# Patient Record
Sex: Female | Born: 1950 | Hispanic: No | Marital: Married | Smoking: Never smoker
Health system: Southern US, Community
[De-identification: ages and names within clinical notes are randomized; demographics above are authoritative.]

## PROBLEM LIST (undated history)

## (undated) DIAGNOSIS — E7800 Pure hypercholesterolemia, unspecified: Secondary | ICD-10-CM

## (undated) DIAGNOSIS — E0852 Diabetes mellitus due to underlying condition with diabetic peripheral angiopathy with gangrene: Secondary | ICD-10-CM

## (undated) DIAGNOSIS — D225 Melanocytic nevi of trunk: Secondary | ICD-10-CM

## (undated) DIAGNOSIS — D329 Benign neoplasm of meninges, unspecified: Secondary | ICD-10-CM

## (undated) DIAGNOSIS — K219 Gastro-esophageal reflux disease without esophagitis: Secondary | ICD-10-CM

## (undated) DIAGNOSIS — I499 Cardiac arrhythmia, unspecified: Secondary | ICD-10-CM

## (undated) DIAGNOSIS — K760 Fatty (change of) liver, not elsewhere classified: Secondary | ICD-10-CM

## (undated) DIAGNOSIS — E119 Type 2 diabetes mellitus without complications: Secondary | ICD-10-CM

## (undated) DIAGNOSIS — R809 Proteinuria, unspecified: Secondary | ICD-10-CM

## (undated) DIAGNOSIS — E559 Vitamin D deficiency, unspecified: Secondary | ICD-10-CM

## (undated) DIAGNOSIS — R000 Tachycardia, unspecified: Secondary | ICD-10-CM

## (undated) DIAGNOSIS — I10 Essential (primary) hypertension: Secondary | ICD-10-CM

## (undated) DIAGNOSIS — R569 Unspecified convulsions: Secondary | ICD-10-CM

## (undated) HISTORY — DX: Melanocytic nevi of trunk: D22.5

## (undated) HISTORY — DX: Type 2 diabetes mellitus without complications: E11.9

## (undated) HISTORY — DX: Fatty (change of) liver, not elsewhere classified: K76.0

## (undated) HISTORY — DX: Essential (primary) hypertension: I10

## (undated) HISTORY — PX: DILATION AND CURETTAGE OF UTERUS: SHX78

## (undated) HISTORY — DX: Proteinuria, unspecified: R80.9

## (undated) HISTORY — PX: CHOLECYSTECTOMY: SHX55

## (undated) HISTORY — DX: Unspecified convulsions: R56.9

## (undated) HISTORY — DX: Tachycardia, unspecified: R00.0

## (undated) HISTORY — DX: Gastro-esophageal reflux disease without esophagitis: K21.9

## (undated) HISTORY — PX: UTERINE FIBROID SURGERY: SHX826

## (undated) HISTORY — DX: Diabetes mellitus due to underlying condition with diabetic peripheral angiopathy with gangrene: E08.52

## (undated) HISTORY — DX: Vitamin D deficiency, unspecified: E55.9

---

## 2009-07-09 ENCOUNTER — Ambulatory Visit: Payer: Self-pay | Admitting: Gynecology

## 2009-07-13 ENCOUNTER — Ambulatory Visit: Payer: Self-pay | Admitting: Gynecology

## 2009-08-06 ENCOUNTER — Ambulatory Visit (HOSPITAL_COMMUNITY): Admission: RE | Admit: 2009-08-06 | Discharge: 2009-08-06 | Payer: Self-pay | Admitting: Gynecology

## 2009-08-06 ENCOUNTER — Ambulatory Visit: Payer: Self-pay | Admitting: Gynecology

## 2009-08-20 ENCOUNTER — Ambulatory Visit: Payer: Self-pay | Admitting: Gynecology

## 2010-06-20 HISTORY — PX: BRAIN SURGERY: SHX531

## 2010-07-11 ENCOUNTER — Encounter: Payer: Self-pay | Admitting: Family Medicine

## 2010-09-09 LAB — BASIC METABOLIC PANEL
BUN: 16 mg/dL (ref 6–23)
CO2: 29 mEq/L (ref 19–32)
Calcium: 9.4 mg/dL (ref 8.4–10.5)
Chloride: 101 mEq/L (ref 96–112)
Creatinine, Ser: 0.69 mg/dL (ref 0.4–1.2)
GFR calc Af Amer: >60 mL/min (ref >60)
GFR calc non Af Amer: >60 mL/min (ref >60)
Glucose, Bld: 80 mg/dL (ref 70–99)
Potassium: 3.7 mEq/L (ref 3.5–5.1)
Sodium: 136 mEq/L (ref 135–145)

## 2010-09-09 LAB — CBC
HCT: 40.5 % (ref 36.0–46.0)
Hemoglobin: 13.3 g/dL (ref 12.0–15.0)
MCHC: 33 g/dL (ref 30.0–36.0)
MCV: 83.6 fL (ref 78.0–100.0)
Platelets: 363 10*3/uL (ref 150–400)
RBC: 4.84 MIL/uL (ref 3.87–5.11)
RDW: 14 % (ref 11.5–15.5)
WBC: 12.5 10*3/uL — ABNORMAL HIGH (ref 4.0–10.5)

## 2010-09-09 LAB — GLUCOSE, CAPILLARY: Glucose-Capillary: 119 mg/dL — ABNORMAL HIGH (ref 70–99)

## 2011-04-12 ENCOUNTER — Encounter: Payer: Self-pay | Admitting: Physician Assistant

## 2011-04-12 DIAGNOSIS — K760 Fatty (change of) liver, not elsewhere classified: Secondary | ICD-10-CM | POA: Insufficient documentation

## 2011-04-12 DIAGNOSIS — E559 Vitamin D deficiency, unspecified: Secondary | ICD-10-CM | POA: Insufficient documentation

## 2011-04-12 DIAGNOSIS — R809 Proteinuria, unspecified: Secondary | ICD-10-CM | POA: Insufficient documentation

## 2011-04-12 DIAGNOSIS — I10 Essential (primary) hypertension: Secondary | ICD-10-CM | POA: Insufficient documentation

## 2011-04-12 DIAGNOSIS — G43909 Migraine, unspecified, not intractable, without status migrainosus: Secondary | ICD-10-CM | POA: Insufficient documentation

## 2011-04-12 DIAGNOSIS — D225 Melanocytic nevi of trunk: Secondary | ICD-10-CM | POA: Insufficient documentation

## 2011-04-12 DIAGNOSIS — K219 Gastro-esophageal reflux disease without esophagitis: Secondary | ICD-10-CM | POA: Insufficient documentation

## 2011-04-12 DIAGNOSIS — E119 Type 2 diabetes mellitus without complications: Secondary | ICD-10-CM | POA: Insufficient documentation

## 2011-04-12 LAB — BASIC METABOLIC PANEL: Glucose: 138 mg/dL

## 2011-04-12 LAB — HEMOGLOBIN A1C: Hgb A1c MFr Bld: 5.9 % (ref 4.0–6.0)

## 2011-04-12 LAB — HM DIABETES FOOT EXAM: HM Diabetic Foot Exam: NORMAL

## 2011-05-25 ENCOUNTER — Ambulatory Visit: Payer: Self-pay | Admitting: Family Medicine

## 2011-05-27 ENCOUNTER — Encounter (INDEPENDENT_AMBULATORY_CARE_PROVIDER_SITE_OTHER): Payer: BC Managed Care – PPO | Admitting: Family Medicine

## 2011-05-27 DIAGNOSIS — R51 Headache: Secondary | ICD-10-CM

## 2011-05-27 DIAGNOSIS — R6889 Other general symptoms and signs: Secondary | ICD-10-CM

## 2011-05-27 DIAGNOSIS — I10 Essential (primary) hypertension: Secondary | ICD-10-CM

## 2011-05-31 ENCOUNTER — Other Ambulatory Visit: Payer: Self-pay | Admitting: Family Medicine

## 2011-05-31 DIAGNOSIS — G8929 Other chronic pain: Secondary | ICD-10-CM

## 2011-06-02 ENCOUNTER — Ambulatory Visit: Payer: Self-pay | Admitting: Family Medicine

## 2011-06-03 ENCOUNTER — Ambulatory Visit
Admission: RE | Admit: 2011-06-03 | Discharge: 2011-06-03 | Disposition: A | Discharge status: Home / Self Care | Payer: BC Managed Care – PPO | Source: Ambulatory Visit | Attending: Family Medicine | Admitting: Family Medicine

## 2011-06-03 ENCOUNTER — Inpatient Hospital Stay (HOSPITAL_COMMUNITY): Payer: BC Managed Care – PPO

## 2011-06-03 ENCOUNTER — Inpatient Hospital Stay (HOSPITAL_COMMUNITY)
Admission: AD | Admit: 2011-06-03 | Discharge: 2011-06-13 | DRG: 001 | Disposition: A | Discharge status: Home / Self Care | Payer: BC Managed Care – PPO | Source: Ambulatory Visit | Attending: Family Medicine | Admitting: Family Medicine

## 2011-06-03 DIAGNOSIS — Z794 Long term (current) use of insulin: Secondary | ICD-10-CM

## 2011-06-03 DIAGNOSIS — E1159 Type 2 diabetes mellitus with other circulatory complications: Secondary | ICD-10-CM | POA: Diagnosis present

## 2011-06-03 DIAGNOSIS — G43009 Migraine without aura, not intractable, without status migrainosus: Secondary | ICD-10-CM

## 2011-06-03 DIAGNOSIS — R413 Other amnesia: Secondary | ICD-10-CM | POA: Diagnosis present

## 2011-06-03 DIAGNOSIS — G8929 Other chronic pain: Secondary | ICD-10-CM

## 2011-06-03 DIAGNOSIS — K7689 Other specified diseases of liver: Secondary | ICD-10-CM | POA: Diagnosis present

## 2011-06-03 DIAGNOSIS — E559 Vitamin D deficiency, unspecified: Secondary | ICD-10-CM | POA: Diagnosis present

## 2011-06-03 DIAGNOSIS — E871 Hypo-osmolality and hyponatremia: Secondary | ICD-10-CM | POA: Diagnosis not present

## 2011-06-03 DIAGNOSIS — I798 Other disorders of arteries, arterioles and capillaries in diseases classified elsewhere: Secondary | ICD-10-CM | POA: Diagnosis present

## 2011-06-03 DIAGNOSIS — D329 Benign neoplasm of meninges, unspecified: Secondary | ICD-10-CM | POA: Diagnosis present

## 2011-06-03 DIAGNOSIS — I10 Essential (primary) hypertension: Secondary | ICD-10-CM

## 2011-06-03 DIAGNOSIS — D496 Neoplasm of unspecified behavior of brain: Secondary | ICD-10-CM

## 2011-06-03 DIAGNOSIS — E119 Type 2 diabetes mellitus without complications: Secondary | ICD-10-CM | POA: Insufficient documentation

## 2011-06-03 DIAGNOSIS — D320 Benign neoplasm of cerebral meninges: Principal | ICD-10-CM | POA: Diagnosis present

## 2011-06-03 DIAGNOSIS — J387 Other diseases of larynx: Secondary | ICD-10-CM | POA: Diagnosis present

## 2011-06-03 DIAGNOSIS — G936 Cerebral edema: Secondary | ICD-10-CM | POA: Diagnosis present

## 2011-06-03 DIAGNOSIS — G43909 Migraine, unspecified, not intractable, without status migrainosus: Secondary | ICD-10-CM | POA: Diagnosis present

## 2011-06-03 HISTORY — DX: Pure hypercholesterolemia, unspecified: E78.00

## 2011-06-03 HISTORY — DX: Benign neoplasm of meninges, unspecified: D32.9

## 2011-06-03 HISTORY — DX: Cardiac arrhythmia, unspecified: I49.9

## 2011-06-03 LAB — CBC
HCT: 39.3 % (ref 36.0–46.0)
Hemoglobin: 13 g/dL (ref 12.0–15.0)
MCH: 27.1 pg (ref 26.0–34.0)
MCHC: 33.1 g/dL (ref 30.0–36.0)
MCV: 82 fL (ref 78.0–100.0)
Platelets: 432 10*3/uL — ABNORMAL HIGH (ref 150–400)
RBC: 4.79 MIL/uL (ref 3.87–5.11)
RDW: 13.8 % (ref 11.5–15.5)
WBC: 15.2 10*3/uL — ABNORMAL HIGH (ref 4.0–10.5)

## 2011-06-03 LAB — COMPREHENSIVE METABOLIC PANEL
ALT: 19 U/L (ref 0–35)
AST: 18 U/L (ref 0–37)
Albumin: 3.6 g/dL (ref 3.5–5.2)
Alkaline Phosphatase: 64 U/L (ref 39–117)
BUN: 17 mg/dL (ref 6–23)
CO2: 22 mEq/L (ref 19–32)
Calcium: 9.1 mg/dL (ref 8.4–10.5)
Chloride: 101 mEq/L (ref 96–112)
Creatinine, Ser: 0.73 mg/dL (ref 0.50–1.10)
GFR calc Af Amer: >90 mL/min (ref >90)
GFR calc non Af Amer: >90 mL/min (ref >90)
Glucose, Bld: 111 mg/dL — ABNORMAL HIGH (ref 70–99)
Potassium: 3.7 mEq/L (ref 3.5–5.1)
Sodium: 135 mEq/L (ref 135–145)
Total Bilirubin: 0.1 mg/dL — ABNORMAL LOW (ref 0.3–1.2)
Total Protein: 7.1 g/dL (ref 6.0–8.3)

## 2011-06-03 LAB — TYPE AND SCREEN
ABO/RH(D): O NEG
Antibody Screen: NEGATIVE

## 2011-06-03 LAB — PROTIME-INR
INR: 0.94 (ref 0.00–1.49)
Prothrombin Time: 12.8 seconds (ref 11.6–15.2)

## 2011-06-03 LAB — GLUCOSE, CAPILLARY: Glucose-Capillary: 102 mg/dL — ABNORMAL HIGH (ref 70–99)

## 2011-06-03 LAB — APTT: aPTT: 31 seconds (ref 24–37)

## 2011-06-03 LAB — ABO/RH: ABO/RH(D): O NEG

## 2011-06-03 MED ORDER — SODIUM CHLORIDE 0.9 % IV SOLN: 250.0000 mL | INTRAVENOUS | Status: DC | PRN

## 2011-06-03 MED ORDER — INSULIN ASPART 100 UNIT/ML ~~LOC~~ SOLN
0.0000 [IU] | Freq: Three times a day (TID) | SUBCUTANEOUS | Status: DC
  Administered 2011-06-04: 7 [IU] via SUBCUTANEOUS
  Administered 2011-06-04: 2 [IU] via SUBCUTANEOUS
  Administered 2011-06-04: 7 [IU] via SUBCUTANEOUS
  Administered 2011-06-05 (×2): 3 [IU] via SUBCUTANEOUS
  Administered 2011-06-05 – 2011-06-06 (×4): 5 [IU] via SUBCUTANEOUS
  Administered 2011-06-07: 11 [IU] via SUBCUTANEOUS
  Administered 2011-06-07: 5 [IU] via SUBCUTANEOUS
  Administered 2011-06-07: 8 [IU] via SUBCUTANEOUS
  Administered 2011-06-08: 3 [IU] via SUBCUTANEOUS
  Filled 2011-06-03: qty 3

## 2011-06-03 MED ORDER — GADOBENATE DIMEGLUMINE 529 MG/ML IV SOLN
20.0000 mL | Freq: Once | INTRAVENOUS | Status: AC | PRN
  Administered 2011-06-03: 20 mL via INTRAVENOUS

## 2011-06-03 MED ORDER — DEXAMETHASONE 4 MG PO TABS
4.0000 mg | ORAL_TABLET | Freq: Four times a day (QID) | ORAL | Status: DC
  Administered 2011-06-03 – 2011-06-09 (×22): 4 mg via ORAL
  Filled 2011-06-03 (×27): qty 1

## 2011-06-03 MED ORDER — SODIUM CHLORIDE 0.9 % IJ SOLN: 3.0000 mL | INTRAMUSCULAR | Status: DC | PRN

## 2011-06-03 MED ORDER — SODIUM CHLORIDE 0.9 % IJ SOLN
3.0000 mL | Freq: Two times a day (BID) | INTRAMUSCULAR | Status: DC
  Administered 2011-06-03 – 2011-06-08 (×9): 3 mL via INTRAVENOUS

## 2011-06-03 NOTE — Progress Notes (Signed)
MD on call paged and notified about the new admit.

## 2011-06-03 NOTE — H&P (Signed)
Family Medicine Teaching [facility] Admission History and Physical  Patient name: Victoria Mullen Medical record number: [id number] Date of birth: Feb 19, 1951 Age: 60 y.o. Gender: female  Primary Care Provider: Dow Adolph, MD,   Chief Complaint: Headache History of Present Illness: Victoria Mullen is a 60 y.o. year old female presenting with intense headache for 1 month. She has hx of migraine for years that has been increasing in intensity and frequency for the past month. They happen almost every day and last for hours. They alleviate with ibuprofen 800 mg or by resting, but there is a basal level of discomfort even when the acute pain is gone. They are preceded by visual aura of "neon colored floaters" and are associated with nausea and vomiting. The pain  localization is generalized and there is no associated symptoms of decreased of sensation, weakness, numbness, tingling. The only positive finding is that she does not noticed it, but her husband mentions she has problems with short term memory lately and becoming repetitive. Pt was a direct admit from [facility] urgent care.  Past Medical History: Diagnosis  . Migraine  . Essential hypertension, benign  . Fatty liver disease, nonalcoholic  . 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 Melanocytic nevi of trunk  . Tachycardia, unspecified  . Vitamin D insufficiency  . Microalbuminuria  . Laryngopharyngeal reflux (LPR)    Past Surgical History: Gall bladder removed years ago.  Social History: Lives with husband. No children. Non smoker, no drugs or alcohol.   Family History: Mother died of squamous cell carcinoma of Larynx(smoker). Rest unremarkable.   Allergies: . Niaspan (Niacin (Antihyperlipidemic)) Other (See Comments)    Flushing-sleep disruption    Review Of Systems: Per HPI  Otherwise 12 point review of systems was performed and was  unremarkable.  Physical Exam: Filed Vitals:   06/03/11 1700  BP: 108/65  Pulse: 99  Temp: 98.2 F (36.8 C)  Resp: 20   General: alert, cooperative, appears stated age and no distress HEENT: extra ocular movement intact, sclera clear, anicteric and oropharynx clear, no lesions Heart: RRR, no murmurs, gallops or rubs. Lungs: Normal work of breathing. Clear breath sound bilaterally, no rales or wheezes. Abdomen: Soft, no tender to palpation. Normal bowel sounds. Extremities: extremities normal, atraumatic, no cyanosis or edema Neurology: normal without focal findings, mental status, speech normal, alert and oriented x3, PERLA, cranial nerves 2-12 intact, reflexes normal and symmetric, sensation grossly normal and finger to nose and cerebellar exam normal  Labs and Imaging: Component Value   NA 136   K 3.7   CL 101   CO2 29   BUN 16   CREATININE 0.69   GLUCOSE 80   CT head without contrast 06/03/11 IMPRESSION:  1. Large anterior cranial fossa tumor resulting in left greater than right frontal lobe edema and mass effect, including anterior rightward midline shift of up to 11 mm.  Top differential considerations are large meningioma and high-grade primary brain tumor. Follow-up brain MRI without and with contrast recommended to further characterize preoperatively.  Critical Value/emergent results were called by telephone at the time of interpretation on 06/03/2011 at 1650 hours to PA Kennedy Bucker, who verbally acknowledged these results. Ct cervical spine 06/03/11 IMPRESSION:  No acute fracture or listhesis identified in the cervical spine. Ligamentous injury is not excluded.   Assessment and Plan: Victoria Mullen is a 60 y.o. year old female presenting with headache and a CT positive for anterior cranial  fossa tumor with edema and mass effect. 1.  Neuro: No neurologic focalization on physical exam. Pt hemodynamically stable. No headache now.  Dr. Venetia Maxon of Neurosurgery has been  consulted.The plan is inpatient surgery.  - start Decadron to help with cerebral edema. Neurological monitoring. Preop tests. We will f/u Neurosurgery recommendations. 2.  HTN: Controlled with home meds (valsartan/HCTZ).  - will continue home meds starting tomorrow and monitor. 3.  DM: A1C 6.3 Normal glucose today.  -SSI and CHO diet. 4.FEN/GI: CHO diet . SL IV 5. Prophylaxis: SCD's, PPI 6. Disposition: Pending improvement and surgery.

## 2011-06-04 LAB — CBC
HCT: 38.6 % (ref 36.0–46.0)
Hemoglobin: 12.6 g/dL (ref 12.0–15.0)
MCH: 27.1 pg (ref 26.0–34.0)
MCHC: 32.6 g/dL (ref 30.0–36.0)
MCV: 83 fL (ref 78.0–100.0)
Platelets: 412 10*3/uL — ABNORMAL HIGH (ref 150–400)
RBC: 4.65 MIL/uL (ref 3.87–5.11)
RDW: 13.8 % (ref 11.5–15.5)
WBC: 12.5 10*3/uL — ABNORMAL HIGH (ref 4.0–10.5)

## 2011-06-04 LAB — HEMOGLOBIN A1C
Hgb A1c MFr Bld: 6.2 % — ABNORMAL HIGH (ref <5.7)
Mean Plasma Glucose: 131 mg/dL — ABNORMAL HIGH (ref <117)

## 2011-06-04 LAB — GLUCOSE, CAPILLARY
Glucose-Capillary: 135 mg/dL — ABNORMAL HIGH (ref 70–99)
Glucose-Capillary: 190 mg/dL — ABNORMAL HIGH (ref 70–99)
Glucose-Capillary: 266 mg/dL — ABNORMAL HIGH (ref 70–99)
Glucose-Capillary: 287 mg/dL — ABNORMAL HIGH (ref 70–99)

## 2011-06-04 NOTE — Consult Note (Signed)
Reason for Consult:brain tumor Referring Physician: [name] is an 60 y.o. female.  Victoria Mullen is a 60 y.o. year old female presenting with intense headache for 1 month. She has hx of migraine for years that has been increasing in intensity and frequency for the past month. They happen almost every day and last for hours. They alleviate with ibuprofen 800 mg or by resting, but there is a basal level of discomfort even when the acute pain is gone. They are preceded by visual aura of "neon colored floaters" and are associated with nausea and vomiting. The pain localization is generalized and there is no associated symptoms of decreased of sensation, weakness, numbness, tingling. The only positive finding is that she does not noticed it, but her husband mentions she has problems with short term memory lately and becoming repetitive.  Pt was a direct admit from [facility] urgent care. Patient notes both progressive worsening of headaches over the last few months but also significant problems with short-term memory. She has become quite forgetful and no longer has any energy or interest in the things that she usually has enjoyed. 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  Past Medical History:  Diagnosis   .  Migraine   .  Essential hypertension, benign   .  Fatty liver disease, nonalcoholic   .  Diabetes mellitus due to underlying condition with diabetic peripheral angiopathy with gangrene   .  Melanocytic nevi of trunk   .  Tachycardia, unspecified   .  Vitamin D insufficiency   .  Microalbuminuria   .  Laryngopharyngeal reflux (LPR)    Past Surgical History:  Gall bladder removed years ago.   Social History:  Lives with husband. Husband works as Radiographer, therapeutic. No children. Non smoker, no drugs or alcohol.   Family  History:  Mother died of squamous cell carcinoma of Larynx(smoker). Rest unremarkable.   Allergies:  .  Niaspan (Niacin (Antihyperlipidemic))  Other (See Comments)     Flushing-sleep disruption    Review Of Systems: Per HPI  Otherwise 12 point review of systems was performed and was unremarkable.   Medications: I have reviewed the patient's current medications.  Results for orders placed during the hospital encounter of 06/03/11 (from the past 48 hour(s))  TYPE AND SCREEN     Status: Normal   Collection Time   06/03/11  7:47 PM      Component Value Range Comment   ABO/RH(D) O NEG      Antibody Screen NEG      Sample Expiration 06/06/2011     ABO/RH     Status: Normal   Collection Time   06/03/11  7:47 PM      Component Value Range Comment   ABO/RH(D) O NEG     COMPREHENSIVE METABOLIC PANEL     Status: Abnormal   Collection Time   06/03/11  7:50 PM      Component Value Range Comment   Sodium 135  135 - 145 (mEq/L)    Potassium 3.7  3.5 - 5.1 (mEq/L)    Chloride 101  96 - 112 (mEq/L)    CO2 22  19 - 32 (mEq/L)    Glucose, Bld 111 (*) 70 - 99 (mg/dL)    BUN 17  6 - 23 (mg/dL)    Creatinine, Ser  0.73  0.50 - 1.10 (mg/dL)    Calcium 9.1  8.4 - 10.5 (mg/dL)    Total Protein 7.1  6.0 - 8.3 (g/dL)    Albumin 3.6  3.5 - 5.2 (g/dL)    AST 18  0 - 37 (U/L) HEMOLYSIS AT THIS LEVEL MAY AFFECT RESULT   ALT 19  0 - 35 (U/L)    Alkaline Phosphatase 64  39 - 117 (U/L)    Total Bilirubin 0.1 (*) 0.3 - 1.2 (mg/dL)    GFR calc non Af Amer >90  >90 (mL/min)    GFR calc Af Amer >90  >90 (mL/min)   CBC     Status: Abnormal   Collection Time   06/03/11  7:50 PM      Component Value Range Comment   WBC 15.2 (*) 4.0 - 10.5 (K/uL)    RBC 4.79  3.87 - 5.11 (MIL/uL)    Hemoglobin 13.0  12.0 - 15.0 (g/dL)    HCT 45.4  09.8 - 11.9 (%)    MCV 82.0  78.0 - 100.0 (fL)    MCH 27.1  26.0 - 34.0 (pg)    MCHC 33.1  30.0 - 36.0 (g/dL)    RDW 14.7  82.9 - 56.2 (%)    Platelets 432 (*) 150 - 400  (K/uL)   PROTIME-INR     Status: Normal   Collection Time   06/03/11  7:50 PM      Component Value Range Comment   Prothrombin Time 12.8  11.6 - 15.2 (seconds)    INR 0.94  0.00 - 1.49    APTT     Status: Normal   Collection Time   06/03/11  7:50 PM      Component Value Range Comment   aPTT 31  24 - 37 (seconds)   HEMOGLOBIN A1C     Status: Abnormal   Collection Time   06/03/11  7:50 PM      Component Value Range Comment   Hemoglobin A1C 6.2 (*) <5.7 (%)    Mean Plasma Glucose 131 (*) <117 (mg/dL)   GLUCOSE, CAPILLARY     Status: Abnormal   Collection Time   06/03/11 10:01 PM      Component Value Range Comment   Glucose-Capillary 102 (*) 70 - 99 (mg/dL)    Comment 1 Documented in Chart      Comment 2 Notify RN     CBC     Status: Abnormal   Collection Time   06/04/11  7:40 AM      Component Value Range Comment   WBC 12.5 (*) 4.0 - 10.5 (K/uL)    RBC 4.65  3.87 - 5.11 (MIL/uL)    Hemoglobin 12.6  12.0 - 15.0 (g/dL)    HCT 13.0  86.5 - 78.4 (%)    MCV 83.0  78.0 - 100.0 (fL)    MCH 27.1  26.0 - 34.0 (pg)    MCHC 32.6  30.0 - 36.0 (g/dL)    RDW 69.6  29.5 - 28.4 (%)    Platelets 412 (*) 150 - 400 (K/uL)   GLUCOSE, CAPILLARY     Status: Abnormal   Collection Time   06/04/11  7:59 AM      Component Value Range Comment   Glucose-Capillary 135 (*) 70 - 99 (mg/dL)     Dg Chest 2 View  13/24/4010  *RADIOLOGY REPORT*  Clinical Data: Pre op for surg  CHEST - 2 VIEW  Comparison: None.  Findings: The  heart size and mediastinal contours are within normal limits.  Both lungs are clear.  The visualized skeletal structures are unremarkable.  IMPRESSION: Negative exam.  Original Report Authenticated By: Rosealee Albee, M.D.   Ct Head Wo Contrast  06/03/2011  *RADIOLOGY REPORT*  Clinical Data:  60 year old female with chronic headaches and memory loss.  CT HEAD WITHOUT CONTRAST CT CERVICAL SPINE WITHOUT CONTRAST  Technique:  Multidetector CT imaging of the head and cervical spine  was performed following the standard protocol without intravenous contrast.  Multiplanar CT image reconstructions of the cervical spine were also generated.  Comparison:  None.  CT HEAD  Findings: Large anterior heterogeneous intracranial mass lesion affects both inferior frontal gyri.  There are both hyperdense, partially calcified components, and low density cystic appearing components.  Cerebral edema tracks from the anterior frontal lobes posteriorly, and on the left extends through the external capsule. There is rightward midline shift of 11 mm.  No ventriculomegaly. Edema is also noted in the left anterior temporal tip.  No acute intracranial hemorrhage identified.  No second intracranial mass is identified.  Visualized paranasal sinuses and mastoids are clear.  Visualized orbit soft tissues are within normal limits.  No bony changes in the anterior cranial fossa are identified.  IMPRESSION: 1.  Large anterior cranial fossa tumor resulting in left greater than right frontal lobe edema and mass effect, including anterior rightward midline shift of up to 11 mm. Top differential considerations are large meningioma and high-grade primary brain tumor.  Follow-up brain MRI without and with contrast recommended to further characterize preoperatively.  Critical Value/emergent results were called by telephone at the time of interpretation on 06/03/2011  at 1650 hours   to  PA Kennedy Bucker, who verbally acknowledged these results.  2.  Cervical findings are below.  CT CERVICAL SPINE  Findings: Visualized skull base is intact.  No atlanto-occipital dissociation.  Mild straightening of cervical lordosis.  Disc space narrowing with endplate spurring at C5-C6 and C6-C7.  Trace anterolisthesis of C7 on T1 is associated with the facet hypertrophy. Bilateral posterior element alignment is within normal limits.  No acute cervical fracture identified.  Negative visualized lung apices.  Enlarged right thyroid lobe has  homogeneous CT density.  Other visualized paraspinal soft tissues are within normal limits.  IMPRESSION: No acute fracture or listhesis identified in the cervical spine. Ligamentous injury is not excluded.  Original Report Authenticated By: Harley Hallmark, M.D.   Ct Cervical Spine Wo Contrast  06/03/2011  *RADIOLOGY REPORT*  Clinical Data:  60 year old female with chronic headaches and memory loss.  CT HEAD WITHOUT CONTRAST CT CERVICAL SPINE WITHOUT CONTRAST  Technique:  Multidetector CT imaging of the head and cervical spine was performed following the standard protocol without intravenous contrast.  Multiplanar CT image reconstructions of the cervical spine were also generated.  Comparison:  None.  CT HEAD  Findings: Large anterior heterogeneous intracranial mass lesion affects both inferior frontal gyri.  There are both hyperdense, partially calcified components, and low density cystic appearing components.  Cerebral edema tracks from the anterior frontal lobes posteriorly, and on the left extends through the external capsule. There is rightward midline shift of 11 mm.  No ventriculomegaly. Edema is also noted in the left anterior temporal tip.  No acute intracranial hemorrhage identified.  No second intracranial mass is identified.  Visualized paranasal sinuses and mastoids are clear.  Visualized orbit soft tissues are within normal limits.  No bony changes in the anterior cranial fossa are  identified.  IMPRESSION: 1.  Large anterior cranial fossa tumor resulting in left greater than right frontal lobe edema and mass effect, including anterior rightward midline shift of up to 11 mm. Top differential considerations are large meningioma and high-grade primary brain tumor.  Follow-up brain MRI without and with contrast recommended to further characterize preoperatively.  Critical Value/emergent results were called by telephone at the time of interpretation on 06/03/2011  at 1650 hours   to  PA Kennedy Bucker,  who verbally acknowledged these results.  2.  Cervical findings are below.  CT CERVICAL SPINE  Findings: Visualized skull base is intact.  No atlanto-occipital dissociation.  Mild straightening of cervical lordosis.  Disc space narrowing with endplate spurring at C5-C6 and C6-C7.  Trace anterolisthesis of C7 on T1 is associated with the facet hypertrophy. Bilateral posterior element alignment is within normal limits.  No acute cervical fracture identified.  Negative visualized lung apices.  Enlarged right thyroid lobe has homogeneous CT density.  Other visualized paraspinal soft tissues are within normal limits.  IMPRESSION: No acute fracture or listhesis identified in the cervical spine. Ligamentous injury is not excluded.  Original Report Authenticated By: Harley Hallmark, M.D.   Mr Laqueta Jean ZO Contrast  06/03/2011  *RADIOLOGY REPORT*  Clinical Data: Headache.  Abnormal CT.  MRI HEAD WITHOUT AND WITH CONTRAST  Technique:  Multiplanar, multiecho pulse sequences of the brain and surrounding structures were obtained according to standard protocol without and with intravenous contrast  Contrast: 20mL MULTIHANCE GADOBENATE DIMEGLUMINE 529 MG/ML IV SOLN  Comparison: 06/03/2011 CT.  No comparison MR.  Findings: Mass based at the planum sphenoidale (and distorting the planum sphenoidale /sphenoid bone) has a solid enhancing component which is an extra-axial measuring 3.6 x 3.9 x 2.2 cm elevating the anterior cerebral arteries having an appearance most consistent with a planum sphenoidale meningioma.  This appears complex as there is an  irregular enhancement prominent / cystic component extending into the left frontal region causing marked distortion of the left frontal lobe and left lateral ventricle with posterior displacement of frontal horn of the left lateral ventricle and midline shift to the right by 10.7 mm.  This aspect of the mass has maximal dimension of 4.9 x 3.2 x 3.2 cm.  My overall suspicion is that this  represents a meningioma with an associated cystic components/arachnoid cyst with marked surrounding vasogenic edema extending throughout the frontal lobes bilaterally greater on the left with edema extending to the involve left basal ganglia, left sub insular region and periopercular region.  Other types of malignancy not entirely excluded although felt to be secondary considerations.  Early trapping of the right lateral ventricle may be present.  Lower aspect of the anterior falx just above the anterior body of the corpus callosum there is a 7.7 x 6.9 x 6.3 mm nodular enhancing structure which may represent a second meningioma.  Metastatic disease as cause of both of these findings felt to be less likely consideration.  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  No acute thrombotic infarct.  No intracranial hemorrhage.  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  Fusiform dilation of the left middle cerebral artery at the level of the bifurcation.  Original Report Authenticated By: Fuller Canada, M.D.    Review of Systems - Negative  except as above   Blood pressure 114/67, pulse 88, temperature 96.8 F (36 C), temperature source Oral, resp. rate 20, height 5' 3.6" (1.615 m), weight 73.8 kg (162 lb 11.2 oz), SpO2 97.00%. Patient is alert oriented and cooperative. She speaks with clear and fluent speech. She is somewhat forgetful and does not recall her age.  Pupils are equal round and reactive to light. Extraocular movements are full face is symmetric without numbness and with good motor function. Tongue protrudes in the midline. She denies loss of sense of smell.  Motor strength is full in all motor groups bilaterally symmetric without evidence of pronator drift. She  denies any numbness in her upper or lower extremities.  Reflexes are symmetric in the upper and lower extremities. Great toe is upgoing on the right and downgoing on the left.  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 I met with the patient and her husband and spent one hour in discussion with them both and have recommended that she undergo bicoronal craniotomy with resection of this large tumor. I also recommended that she continue on Decadron for at least several days before proceeding with surgery to control her bifrontal brain edema. We discussed risks and benefits of surgery as well as the expected course and potential surgical and perioperative complications. They understand these risks but also recognized that without surgery she will likely worsen and could eventually die from the significant brain edema. She wishes to proceed. I have spoken with my colleague, Dr. Coletta Memos about her problem and need for surgery and he will take over and perform her surgery.  Dorian Heckle, MD 06/04/2011, 11:29 AM

## 2011-06-04 NOTE — H&P (Signed)
[facility] Attending Admission Note: Kaloni Bisaillon MD [phone] pager office [phone] I  have seen and examined this patient, reviewed their chart. I have discussed this patient with the resident. I agree with the resident's findings, assessment and care plan. 

## 2011-06-04 NOTE — Progress Notes (Signed)
Subjective: No o/n events. Still c/o headache No neurological changes on 3 exams overnight. No emesis/nausea. No syncope.   Objective: Vital signs in last 24 hours: Temp:  [96.8 F (36 C)-98.2 F (36.8 C)] 96.8 F (36 C) (12/15 0540) Pulse Rate:  [82-99] 88  (12/15 0540) Resp:  [20] 20  (12/15 0540) BP: (108-116)/(65-71) 114/67 mmHg (12/15 0540) SpO2:  [95 %-97 %] 97 % (12/15 0540) Weight:  [162 lb 11.2 oz (73.8 kg)] 162 lb 11.2 oz (73.8 kg) (12/14 1700) Weight change:    Lungs:  Normal respiratory effort, chest expands symmetrically. Lungs are clear to auscultation, no crackles or wheezes. Heart - Regular rate and rhythm.  No murmurs, gallops or rubs.    Neurologic exam : Cn 2-7 intact Strength equal & normal in upper & lower extremities Able to walk on heels and toes.   Balance normal  Romberg normal, finger to nose   Intake/Output from previous day: 12/14 0701 - 12/15 0700 In: 350 [P.O.:350] Out: 1 [Urine:1] Intake/Output this shift: Total I/O In: 240 [P.O.:240] Out: -    Lab Results:  [facility] 06/04/11 0740 06/03/11 1950  WBC 12.5* 15.2*  HGB 12.6 13.0  HCT 38.6 39.3  PLT 412* 432*   BMET  Basename 06/03/11 1950  NA 135  K 3.7  CL 101  CO2 22  GLUCOSE 111*  BUN 17  CREATININE 0.73  CALCIUM 9.1    Studies/Results: MRI brain w/wo: IMPRESSION: 06/03/11 Findings suspicious for a complex planum sphenoidale meningioma  with marked local mass effect as described above with possible  second small meningioma along the inferior aspect of the falx.  Fusiform dilation of the left middle cerebral artery at the level  of the bifurcation.   Medications: I have reviewed the patient's current medications.  Assessment/Plan: 1. Brain Tumor -likely meningioma - Dr. Venetia Maxon with neurosurgery on board - preop labs done - decadron 4mg  q 6 hours prn per Neurosurgery - neurologically unchanged -continue neurochecks - possible OR tomorrow pending Dr. Venetia Maxon  consultation  2. DM Lab Results  Component Value Date   HGBA1C 6.2* 06/03/2011  continue with SSI   3. FENGI protonix scd's  4. Dispo: Pending neurosurgical planning  Macaulay Reicher MD 10:14 AM 06/04/2011    LOS: 1 day   Juanda Luba 06/04/2011, 10:10 AM

## 2011-06-05 LAB — GLUCOSE, CAPILLARY
Glucose-Capillary: 200 mg/dL — ABNORMAL HIGH (ref 70–99)
Glucose-Capillary: 236 mg/dL — ABNORMAL HIGH (ref 70–99)
Glucose-Capillary: 171 mg/dL — ABNORMAL HIGH (ref 70–99)
Glucose-Capillary: 278 mg/dL — ABNORMAL HIGH (ref 70–99)

## 2011-06-05 NOTE — Progress Notes (Signed)
FMTS Attending Daily Note: Denny Levy MD [phone] pager office [phone] I  have seen and examined this patient, reviewed their chart. I have discussed this patient with the resident. I agree with the resident's findings, assessment and care plan. Has not had headache since the decadron was started.[facility] plans surgery early this week.

## 2011-06-05 NOTE — Progress Notes (Signed)
Subjective: No o/n events. No neurological changes. No emesis/nausea. No syncope.   Objective: Vital signs in last 24 hours: Temp:  [96.6 F (35.9 C)-97.4 F (36.3 C)] 96.6 F (35.9 C) (12/16 0626) Pulse Rate:  [74-95] 74  (12/16 0626) Resp:  [18-20] 20  (12/16 0626) BP: (116-121)/(71-80) 119/80 mmHg (12/16 0626) SpO2:  [95 %-96 %] 95 % (12/16 0626) Weight change:    Lungs:  Normal respiratory effort, chest expands symmetrically. Lungs are clear to auscultation, no crackles or wheezes. Heart - Regular rate and rhythm.  No murmurs, gallops or rubs.    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  Fusiform dilation of the left middle cerebral artery at the level  of the bifurcation.   Medications: I have reviewed the patient's current medications.  Assessment/Plan: 1. Brain Tumor -likely meningioma - Dr. Venetia Maxon with neurosurgery on board - preop labs done - decadron 4mg  q 6 hours prn per Neurosurgery - neurologically unchanged -continue neurochecks - Continue decadrom- possible surgery in upcoming week- date/time of operation not yet known.  Will follow neurosurgery consult note.   2. DM Lab Results  Component Value Date   HGBA1C 6.2*  06/03/2011  continue with SSI, BG elevated into 200's now with decadron.  Will monitor closely.  Can consider adding lantus for BG tomorrow if CBG's remain elevated for 24 + hours.  Elevations most likely going to occur until pt finished with decadron doses.   3. FENGI protonix scd's  4. Dispo: Pending neurosurgical planning  Holmes Hays MD 6:58 AM 06/05/2011    LOS: 2 days   Porter Nakama 06/05/2011, 6:58 AM

## 2011-06-05 NOTE — Progress Notes (Signed)
Subjective: No headache today, does feel like she has more energy.  "Swimmy headed"  with standing.  Objective: Vital signs in last 24 hours: Temp:  [96.6 F (35.9 C)-97.4 F (36.3 C)] 96.6 F (35.9 C) (12/16 0626) Pulse Rate:  [74-95] 74  (12/16 0626) Resp:  [18-20] 20  (12/16 0626) BP: (116-121)/(71-80) 119/80 mmHg (12/16 0626) SpO2:  [95 %-96 %] 95 % (12/16 0626) Weight change:     Intake/Output from previous day: 12/15 0701 - 12/16 0700 In: 690 [P.O.:690] Out: 1 [Urine:1] Last cbgs: CBG (last 3)   Basename 06/04/11 2130 06/04/11 1713 06/04/11 1155  GLUCAP 287* 266* 190*     Physical Exam Brighter and more rapid responses to questions.  No drift.  Lab Results:  [facility] 06/04/11 0740 06/03/11 1950  WBC 12.5* 15.2*  HGB 12.6 13.0  HCT 38.6 39.3  PLT 412* 432*   BMET  Basename 06/03/11 1950  NA 135  K 3.7  CL 101  CO2 22  GLUCOSE 111*  BUN 17  CREATININE 0.73  CALCIUM 9.1    Studies/Results: Dg Chest 2 View  06/03/2011  *RADIOLOGY REPORT*  Clinical Data: Pre op for surg  CHEST - 2 VIEW  Comparison: None.  Findings: The heart size and mediastinal contours are within normal limits.  Both lungs are clear.  The visualized skeletal structures are unremarkable.  IMPRESSION: Negative exam.  Original Report Authenticated By: Rosealee Albee, M.D.   Ct Head Wo Contrast  06/03/2011  *RADIOLOGY REPORT*  Clinical Data:  60 year old female with chronic headaches and memory loss.  CT HEAD WITHOUT CONTRAST CT CERVICAL SPINE WITHOUT CONTRAST  Technique:  Multidetector CT imaging of the head and cervical spine was performed following the standard protocol without intravenous contrast.  Multiplanar CT image reconstructions of the cervical spine were also generated.  Comparison:  None.  CT HEAD  Findings: Large anterior heterogeneous intracranial mass lesion affects both inferior frontal gyri.  There are both hyperdense, partially calcified components, and low density cystic  appearing components.  Cerebral edema tracks from the anterior frontal lobes posteriorly, and on the left extends through the external capsule. There is rightward midline shift of 11 mm.  No ventriculomegaly. Edema is also noted in the left anterior temporal tip.  No acute intracranial hemorrhage identified.  No second intracranial mass is identified.  Visualized paranasal sinuses and mastoids are clear.  Visualized orbit soft tissues are within normal limits.  No bony changes in the anterior cranial fossa are identified.  IMPRESSION: 1.  Large anterior cranial fossa tumor resulting in left greater than right frontal lobe edema and mass effect, including anterior rightward midline shift of up to 11 mm. Top differential considerations are large meningioma and high-grade primary brain tumor.  Follow-up brain MRI without and with contrast recommended to further characterize preoperatively.  Critical Value/emergent results were called by telephone at the time of interpretation on 06/03/2011  at 1650 hours   to  PA Kennedy Bucker, who verbally acknowledged these results.  2.  Cervical findings are below.  CT CERVICAL SPINE  Findings: Visualized skull base is intact.  No atlanto-occipital dissociation.  Mild straightening of cervical lordosis.  Disc space narrowing with endplate spurring at C5-C6 and C6-C7.  Trace anterolisthesis of C7 on T1 is associated with the facet hypertrophy. Bilateral posterior element alignment is within normal limits.  No acute cervical fracture identified.  Negative visualized lung apices.  Enlarged right thyroid lobe has homogeneous CT density.  Other visualized paraspinal soft  tissues are within normal limits.  IMPRESSION: No acute fracture or listhesis identified in the cervical spine. Ligamentous injury is not excluded.  Original Report Authenticated By: Harley Hallmark, M.D.   Ct Cervical Spine Wo Contrast  06/03/2011  *RADIOLOGY REPORT*  Clinical Data:  60 year old female with chronic  headaches and memory loss.  CT HEAD WITHOUT CONTRAST CT CERVICAL SPINE WITHOUT CONTRAST  Technique:  Multidetector CT imaging of the head and cervical spine was performed following the standard protocol without intravenous contrast.  Multiplanar CT image reconstructions of the cervical spine were also generated.  Comparison:  None.  CT HEAD  Findings: Large anterior heterogeneous intracranial mass lesion affects both inferior frontal gyri.  There are both hyperdense, partially calcified components, and low density cystic appearing components.  Cerebral edema tracks from the anterior frontal lobes posteriorly, and on the left extends through the external capsule. There is rightward midline shift of 11 mm.  No ventriculomegaly. Edema is also noted in the left anterior temporal tip.  No acute intracranial hemorrhage identified.  No second intracranial mass is identified.  Visualized paranasal sinuses and mastoids are clear.  Visualized orbit soft tissues are within normal limits.  No bony changes in the anterior cranial fossa are identified.  IMPRESSION: 1.  Large anterior cranial fossa tumor resulting in left greater than right frontal lobe edema and mass effect, including anterior rightward midline shift of up to 11 mm. Top differential considerations are large meningioma and high-grade primary brain tumor.  Follow-up brain MRI without and with contrast recommended to further characterize preoperatively.  Critical Value/emergent results were called by telephone at the time of interpretation on 06/03/2011  at 1650 hours   to  PA Kennedy Bucker, who verbally acknowledged these results.  2.  Cervical findings are below.  CT CERVICAL SPINE  Findings: Visualized skull base is intact.  No atlanto-occipital dissociation.  Mild straightening of cervical lordosis.  Disc space narrowing with endplate spurring at C5-C6 and C6-C7.  Trace anterolisthesis of C7 on T1 is associated with the facet hypertrophy. Bilateral posterior  element alignment is within normal limits.  No acute cervical fracture identified.  Negative visualized lung apices.  Enlarged right thyroid lobe has homogeneous CT density.  Other visualized paraspinal soft tissues are within normal limits.  IMPRESSION: No acute fracture or listhesis identified in the cervical spine. Ligamentous injury is not excluded.  Original Report Authenticated By: Harley Hallmark, M.D.   Mr Laqueta Jean ON Contrast  06/03/2011  *RADIOLOGY REPORT*  Clinical Data: Headache.  Abnormal CT.  MRI HEAD WITHOUT AND WITH CONTRAST  Technique:  Multiplanar, multiecho pulse sequences of the brain and surrounding structures were obtained according to standard protocol without and with intravenous contrast  Contrast: 20mL MULTIHANCE GADOBENATE DIMEGLUMINE 529 MG/ML IV SOLN  Comparison: 06/03/2011 CT.  No comparison MR.  Findings: Mass based at the planum sphenoidale (and distorting the planum sphenoidale /sphenoid bone) has a solid enhancing component which is an extra-axial measuring 3.6 x 3.9 x 2.2 cm elevating the anterior cerebral arteries having an appearance most consistent with a planum sphenoidale meningioma.  This appears complex as there is an  irregular enhancement prominent / cystic component extending into the left frontal region causing marked distortion of the left frontal lobe and left lateral ventricle with posterior displacement of frontal horn of the left lateral ventricle and midline shift to the right by 10.7 mm.  This aspect of the mass has maximal dimension of 4.9 x 3.2 x 3.2 cm.  My overall suspicion is that this represents a meningioma with an associated cystic components/arachnoid cyst with marked surrounding vasogenic edema extending throughout the frontal lobes bilaterally greater on the left with edema extending to the involve left basal ganglia, left sub insular region and periopercular region.  Other types of malignancy not entirely excluded although felt to be secondary  considerations.  Early trapping of the right lateral ventricle may be present.  Lower aspect of the anterior falx just above the anterior body of the corpus callosum there is a 7.7 x 6.9 x 6.3 mm nodular enhancing structure which may represent a second meningioma.  Metastatic disease as cause of both of these findings felt to be less likely consideration.  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  No acute thrombotic infarct.  No intracranial hemorrhage.  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  Fusiform dilation of the left middle cerebral artery at the level of the bifurcation.  Original Report Authenticated By: Fuller Canada, M.D.    Medications: I have reviewed the patient's current medications.  Assessment/Plan: Dr. Franky Macho to perform bifrontal craniotomy with resection of planum sphenoidale meningioma this coming week.  He will meet with patient and arrange this with her in am.  In the meantime, she is doing better with decadron.  We will plan on continuing steroids until surgery, which should help with her bifrontal brain edema.  Dorian Heckle , MD 06/05/2011, 6:49 AM

## 2011-06-05 NOTE — Progress Notes (Signed)
Dr Clinton Sawyer is aware of Pt's bedtime CBG result.

## 2011-06-06 ENCOUNTER — Other Ambulatory Visit: Payer: Self-pay | Admitting: Neurosurgery

## 2011-06-06 LAB — GLUCOSE, CAPILLARY
Glucose-Capillary: 224 mg/dL — ABNORMAL HIGH (ref 70–99)
Glucose-Capillary: 220 mg/dL — ABNORMAL HIGH (ref 70–99)
Glucose-Capillary: 236 mg/dL — ABNORMAL HIGH (ref 70–99)
Glucose-Capillary: 380 mg/dL — ABNORMAL HIGH (ref 70–99)

## 2011-06-06 MED ORDER — INSULIN GLARGINE 100 UNIT/ML ~~LOC~~ SOLN
5.0000 [IU] | Freq: Every day | SUBCUTANEOUS | Status: DC
  Administered 2011-06-06: 5 [IU] via SUBCUTANEOUS
  Filled 2011-06-06 (×2): qty 3

## 2011-06-06 NOTE — Progress Notes (Signed)
PGY-1 Daily Progress Note Family Medicine Teaching Service D. Piloto Rolene Arbour, MD Service Pager: [phone]  Patient name: Victoria Mullen  Medical record [id number] Date of birth:August 24, 1950 Age: 60 y.o. Gender: female  LOS: 3 days   Subjective: Feeling well, afebrile. No headache since Decadron was started.  Objective:  Vitals: Temp: 97.1 F (36.2 C) (12/17 0541) Pulse Rate: 69  (12/17 0541) Resp:  18  (12/17 0541) BP:  118/68 mmHg (12/17 0541) SpO2: 97 % (12/17 0541)  Physical Exam:  General: alert, cooperative, appears stated age and no distress HEENT: extra ocular movement intact, sclera clear, anicteric and oropharynx clear, no lesions Heart: RRR, no murmurs, gallops or rubs. Lungs: Normal work of breathing. Clear breath sound bilaterally, no rales or wheezes. Abdomen: Soft, no tender to palpation. Normal bowel sounds. Extremities: Extremities normal, atraumatic, no cyanosis or edema Neurology: Normal without focal findings, mental status, speech normal, alert and oriented x3, PERLA, cranial nerves 2-12 intact, reflexes normal and symmetric, sensation grossly normal and finger to nose and cerebellar exam.  Labs and imaging:  CBG's on the mid 200's No other labs needed today.  Medications: Medication Dose Route Frequency  . dexamethasone (DECADRON) tablet 4 mg  4 mg Oral Q6H  . insulin aspart (novoLOG) injection 0-15 Units  0-15 Units Subcutaneous TID WC   Assessment and Plan: 1: Brain Tumor: likely meningioma. neurologically unchanged  Dr. Venetia Maxon with neurosurgery on board. Preop labs done. Awaiting surgery. - Continue Decadron 4mg  q 6 hours prn per Neurosurgery -Continue neurochecks  2. DM HGBA1C  6.2*  06/03/2011, CBG's mid 200's. On Steroids. On SSI - Will add Insuline Lantus 5 units. (At home she administer 26 units a day). - Monitor and adjust dose of Lantus if needed.  3. HTN: Controlled. At Home on Valsartan and HCTZ. -Monitor since Pt on steroids.  Consider adding home meds if hypertension.  4. FENGI - protonix, scd's 5. Dispo: Pending neurosurgical planning  D. Piloto Rolene Arbour, MD PGY1, Family Medicine Teaching Service Pager [phone] 06/06/2011  Patient seen and examined by me, discussed with resident team and I agree with Dr. Willaim Rayas assessment and plan above. Patient reports feeling much improved since beginning the Decadron.  Awaiting Neurosurgery to schedule for OR.  Will continue Decadron, restarting Lantus insulin for DM control. Yasemin Rabon O

## 2011-06-06 NOTE — Progress Notes (Addendum)
Subjective: Patient reports headache improved since being hospitalized. I will assume the decardron is the chief agent in this relief.  Objective: Vital signs in last 24 hours: Temp:  [97.1 F (36.2 C)-97.9 F (36.6 C)] 97.3 F (36.3 C) (12/17 1436) Pulse Rate:  [64-71] 71  (12/17 1436) Resp:  [18-20] 20  (12/17 1436) BP: (118-120)/(63-73) 118/63 mmHg (12/17 1436) SpO2:  [95 %-97 %] 96 % (12/17 1436)  Intake/Output from previous day: 12/16 0701 - 12/17 0700 In: 500 [P.O.:500] Out: -  Intake/Output this shift:    Neurologic: Mental status: Alert, oriented, thought content appropriate, alertness: alert Cranial nerves: II: pupils equal, round, reactive to light and accommodation, V: facial light touch sensation normal bilaterally, VII: upper facial muscle function normal bilaterally, VII: lower facial muscle function normal bilaterally, VIII: hearing normal, XI: trapezius strength normal bilaterally, XII: tongue strength normal  Motor: Normal  Lab Results:  Basename 06/04/11 0740 06/03/11 1950  WBC 12.5* 15.2*  HGB 12.6 13.0  HCT 38.6 39.3  PLT 412* 432*   BMET  Basename 06/03/11 1950  NA 135  K 3.7  CL 101  CO2 22  GLUCOSE 111*  BUN 17  CREATININE 0.73  CALCIUM 9.1    Studies/Results: No results found.  Assessment/Plan: Plan for resection this Thursday. Continue with decadron.   LOS: 3 days     Victoria Mullen 06/06/2011, 7:25 PM

## 2011-06-06 NOTE — Progress Notes (Signed)
Inpatient Diabetes Program Recommendations  AACE/ADA: New Consensus Statement on Inpatient Glycemic Control (2009)  Target Ranges:  Prepandial:   less than 140 mg/dL      Peak postprandial:   less than 180 mg/dL (1-2 hours)      Critically ill patients:  140 - 180 mg/dL   Reason for Visit: Hyperglycemia  Inpatient Diabetes Program Recommendations Insulin - Meal Coverage: Please consider adding meal coverage while on decadron  Note: Note that adding Lantus today is being considered by MD. Burgess Estelle CBG's 200, 236, 171, 278 yesterday.  This am before breakfast 224.

## 2011-06-07 LAB — GLUCOSE, CAPILLARY
Glucose-Capillary: 262 mg/dL — ABNORMAL HIGH (ref 70–99)
Glucose-Capillary: 202 mg/dL — ABNORMAL HIGH (ref 70–99)
Glucose-Capillary: 331 mg/dL — ABNORMAL HIGH (ref 70–99)
Glucose-Capillary: 310 mg/dL — ABNORMAL HIGH (ref 70–99)

## 2011-06-07 MED ORDER — HEPARIN SODIUM (PORCINE) 5000 UNIT/ML IJ SOLN
5000.0000 [IU] | Freq: Three times a day (TID) | INTRAMUSCULAR | Status: AC
  Administered 2011-06-07 – 2011-06-08 (×4): 5000 [IU] via SUBCUTANEOUS
  Filled 2011-06-07 (×6): qty 1

## 2011-06-07 MED ORDER — CEFAZOLIN SODIUM 1-5 GM-% IV SOLN
1.0000 g | INTRAVENOUS | Status: DC
  Filled 2011-06-07: qty 50

## 2011-06-07 MED ORDER — INSULIN GLARGINE 100 UNIT/ML ~~LOC~~ SOLN
10.0000 [IU] | Freq: Every day | SUBCUTANEOUS | Status: DC
  Administered 2011-06-07: 10 [IU] via SUBCUTANEOUS

## 2011-06-07 NOTE — Progress Notes (Signed)
Family Medicine Teaching Service [facility] Progress Note  Patient name: Victoria Mullen Medical record number: [id number] Date of birth: [birth year] Age: 60 y.o. Gender: female    LOS: 4 days   Primary Care Provider: Dow Adolph, MD, MD  Overnight Events: No acute events overnight. Slept well. Tolerating regular diet. Afebrile. Denies headache, nausea, vomiting, diarrhea, chest pain, shortness of breath, lower extremity swelling or pain, syncope. Tolerating Decadron well. Aware of surgery planned for Thursday.   Objective: Vital signs in last 24 hours: Temp:  [97.3 F (36.3 C)-98.9 F (37.2 C)] 97.7 F (36.5 C) (12/18 0630) Pulse Rate:  [62-71] 62  (12/18 0630) Resp:  [18-20] 18  (12/18 0630) BP: (118-126)/(63-66) 126/66 mmHg (12/17 2143) SpO2:  [94 %-96 %] 94 % (12/18 0630)  Wt Readings from Last 3 Encounters:  06/03/11 162 lb 11.2 oz (73.8 kg)  04/12/11 169 lb (76.658 kg)     Current Facility-Administered Medications  Medication Dose Route Frequency Provider Last Rate Last Dose  . 0.9 %  sodium chloride infusion  250 mL Intravenous PRN Dayarmys Piloto, MD      . dexamethasone (DECADRON) tablet 4 mg  4 mg Oral Q6H Jonathan Orton   4 mg at 06/07/11 0509  . insulin aspart (novoLOG) injection 0-15 Units  0-15 Units Subcutaneous TID WC Edd Arbour   5 Units at 06/06/11 1845  . insulin glargine (LANTUS) injection 5 Units  5 Units Subcutaneous Daily Dayarmys Piloto, MD   5 Units at 06/06/11 1606  . sodium chloride 0.9 % injection 3 mL  3 mL Intravenous Q12H Dayarmys Piloto, MD   3 mL at 06/06/11 2319  . sodium chloride 0.9 % injection 3 mL  3 mL Intravenous PRN Dayarmys Piloto, MD         PE: Gen: No acute distress CV: Regular rate and rhythm, no murmurs rubs or gallops Res: Clear to auscultation bilaterally, normal effort Abd: Soft, nontender, normal active bowel sounds Ext/Musc: No edema, 2+ pulses throughout Neuro: Cranial nerves grossly intact  Labs/Studies:      Capillary blood glucose   0800 1211 1727 2144 0806    224 220 236 380 262     Assessment/Plan: 60yo F w/ large brain tumor, likley meningioma and edema w/ resolving HA.   1: Brain Tumor: Likely meningioma. Pt w/o HA since starting Decadron. neurologically unchanged Dr. Venetia Maxon with neurosurgery on board. Preop labs done. Surgery for resection planned for Thursday. Continue Decadron 4mg  q 6 hours prn per Neurosurgery. Continue neurochecks  Q4hrs  2. DM: HGBA1C 6.2* 06/03/2011, CBG's mid 200-300's. On Steroids. On SSI . Started on Lantus 5 units overnight. Home dose of 26 units. Will increase Lantus to 10 units.   3. HTN: Controlled at Home on Valsartan and HCTZ. Will continue to monitor. Consider adding home meds if hypertension.   4. FENGI: Tolerating regular diet. IV KVO.   5. Prophylaxis: protonix, scd's. Pt not wearing SCD's and spending a considerable amount of time in bed. Will discuss adding Heparin 5,000 units TID.    5. Dispo: Pending neurosurgical resection of tumor, and clinical improvement  Signed: Shelly Flatten, MD Family Medicine Resident PGY-1 06/07/2011 8:35 AM

## 2011-06-07 NOTE — Progress Notes (Addendum)
Patient ID: Victoria Mullen, female   DOB: [birth year], 60 y.o.   MRN: [id number] BP 126/66  Pulse 61  Temp(Src) 97.6 F (36.4 C) (Oral)  Resp 18  Ht 5' 3.6" (1.615 m)  Wt 73.8 kg (162 lb 11.2 oz)  BMI 28.28 kg/m2  SpO2 96% Alert and oriented x4. Speech clear and fluent. Symmetric facies, tongue and uvula midline. Moving all exremities well Plan for OR this Thursday.

## 2011-06-07 NOTE — Progress Notes (Signed)
Inpatient Diabetes Program Recommendations  AACE/ADA: New Consensus Statement on Inpatient Glycemic Control (2009)  Target Ranges:  Prepandial:   less than 140 mg/dL      Peak postprandial:   less than 180 mg/dL (1-2 hours)      Critically ill patients:  140 - 180 mg/dL   Reason for Visit: Hyperglycemia into 200's and 300's. Normalizing blood glucose would benefit pt's well being.  Pt needs home regimen.  HgBA1C at 6.2 %  Inpatient Diabetes Program Recommendations Insulin - Basal: Pt takes Lantus 26 units daily.  Please consider addition of Lantus at 15-20 units Insulin - Meal Coverage: [id number] Oral Agents: Pt takes Janumet as well at home (Januvia and Metformin).  Please consider addition of Tradjenta 5 mg once per day (on formulary for Januvia) Diet: Pt eating well,  Note: Lenor Coffin, RN, CNS [phone])

## 2011-06-08 ENCOUNTER — Encounter (HOSPITAL_COMMUNITY): Payer: Self-pay | Admitting: General Practice

## 2011-06-08 LAB — CBC
HCT: 36.9 % (ref 36.0–46.0)
Hemoglobin: 12.3 g/dL (ref 12.0–15.0)
MCH: 27.3 pg (ref 26.0–34.0)
MCHC: 33.3 g/dL (ref 30.0–36.0)
MCV: 82 fL (ref 78.0–100.0)
Platelets: 448 10*3/uL — ABNORMAL HIGH (ref 150–400)
RBC: 4.5 MIL/uL (ref 3.87–5.11)
RDW: 13.8 % (ref 11.5–15.5)
WBC: 14.5 10*3/uL — ABNORMAL HIGH (ref 4.0–10.5)

## 2011-06-08 LAB — GLUCOSE, CAPILLARY
Glucose-Capillary: 199 mg/dL — ABNORMAL HIGH (ref 70–99)
Glucose-Capillary: 224 mg/dL — ABNORMAL HIGH (ref 70–99)
Glucose-Capillary: 283 mg/dL — ABNORMAL HIGH (ref 70–99)
Glucose-Capillary: 308 mg/dL — ABNORMAL HIGH (ref 70–99)

## 2011-06-08 LAB — PROTIME-INR
INR: 0.91 (ref 0.00–1.49)
Prothrombin Time: 12.4 seconds (ref 11.6–15.2)

## 2011-06-08 LAB — APTT: aPTT: 27 seconds (ref 24–37)

## 2011-06-08 MED ORDER — INSULIN GLARGINE 100 UNIT/ML ~~LOC~~ SOLN
15.0000 [IU] | Freq: Every day | SUBCUTANEOUS | Status: DC
  Administered 2011-06-08: 15 [IU] via SUBCUTANEOUS

## 2011-06-08 MED ORDER — INSULIN ASPART 100 UNIT/ML ~~LOC~~ SOLN
0.0000 [IU] | Freq: Three times a day (TID) | SUBCUTANEOUS | Status: DC
  Administered 2011-06-08: 8 [IU] via SUBCUTANEOUS
  Administered 2011-06-08 – 2011-06-11 (×6): 5 [IU] via SUBCUTANEOUS
  Administered 2011-06-12: 8 [IU] via SUBCUTANEOUS
  Administered 2011-06-12: 2 [IU] via SUBCUTANEOUS
  Administered 2011-06-12 – 2011-06-13 (×2): 3 [IU] via SUBCUTANEOUS

## 2011-06-08 MED ORDER — INSULIN GLARGINE 100 UNIT/ML ~~LOC~~ SOLN
5.0000 [IU] | Freq: Every day | SUBCUTANEOUS | Status: DC
  Filled 2011-06-08: qty 3

## 2011-06-08 NOTE — Progress Notes (Signed)
Patient ID: Victoria Mullen, female   DOB: 12-17-1950, 60 y.o.   MRN: [id number] BP 118/72  Pulse 62  Temp(Src) 96.9 F (36.1 C) (Oral)  Resp 18  Ht 5' 3.6" (1.615 m)  Wt 73.8 kg (162 lb 11.2 oz)  BMI 28.28 kg/m2  SpO2 94% Victoria Mullen is alert oriented x4, following all commands. Perrl, full eom Symmetric facies and facial sensation Tongue and uvula midline No drift Moving all extremities. I have recommended a bifrontal craniotomy to resect a planum sphenoidale meningioma. Risks including but not limited to stroke, coma, death, brain damage, inability to remove tumor, weakness, change in personality, bleeding, infection, need for further surgery, etc were discussed. She and her husband agree with proceeding. The operation is planned for tomorrow.

## 2011-06-08 NOTE — Progress Notes (Signed)
PGY-1 Daily Progress Note Family Medicine Teaching Service D. Piloto Rolene Arbour, MD Service Pager: [phone]  Patient name: Victoria Mullen  Medical record [id number]:[id number] Date of birth:1951-01-15 Age: 60 y.o. Gender: female  LOS: 5 days   Subjective:  No events overnight. Good PO intake. No headaches.  Objective:  Vitals: Temp:   96.9 F (36.1 C) (12/19 0625) Pulse Rate: 62  (12/19 0625) Resp:  18  (12/19 0625) BP: 118/72 mmHg (12/19 0625) SpO2: 94 % (12/19 0625)  Physical Exam: General: alert, cooperative, appears stated age and no distress  HEENT: extra ocular movement intact, oropharynx clear, no lesions  Heart: RRR, no murmurs, gallops or rubs.  Lungs: Normal work of breathing. Clear breath sound bilaterally, no rales or wheezes.  Abdomen: Soft, no tender to palpation. Normal bowel sounds.  Extremities:  No edema  Neurology: Normal without focal findings. Unchanged since admission.  Labs and imaging:    CBG's from high 100's to low 300's.   Medications: Medication Dose Route Frequency  . 0.9 %  sodium chloride infusion  250 mL Intravenous PRN  . ceFAZolin (ANCEF) IVPB 1 g/50 mL premix  1 g Intravenous 60 min Pre-Op  . dexamethasone (DECADRON) tablet 4 mg  4 mg Oral Q6H  . heparin injection 5,000 Units  5,000 Units Subcutaneous Q8H  . insulin aspart (novoLOG) injection 0-15 Units  0-15 Units Subcutaneous TID WC  . insulin glargine (LANTUS) injection 10 Units  10 Units Subcutaneous Daily  . sodium chloride 0.9 % injection 3 mL  3 mL Intravenous Q12H  . sodium chloride 0.9 % injection 3 mL  3 mL Intravenous PRN   Assessment and Plan: 1: Brain Tumor: likely meningioma. neurologically unchanged Dr. Venetia Maxon with neurosurgery on board. Preop labs done. Awaiting surgery.  -Continue Decadron 4mg  q 6 hours prn per Neurosurgery  -Continue neurochecks  2. DM  HGBA1C 6.2* 06/03/2011, CBG's from high 100's to low 300's. On Steroids.  -On SSI  - Will increased nsuline Lantus to  15 units. (At home she administer 26 units a day).  - Monitor and adjust dose of Lantus if needed.  3. HTN: Controlled. At Home on Valsartan and HCTZ.  -Monitor since Pt on steroids. Consider adding home meds if hypertension.  4. FENGI  - protonix, scd's  5. Dispo:  Pending surgery planned on Thursday.  D. Piloto Rolene Arbour, MD PGY1, Family Medicine Teaching Service Pager [phone] 06/08/2011  Attending Note Patient seen and examined by me; to discuss with resident team.  I agree with Dr. Willaim Rayas assessment and plan above. Patient is scheduled for OR tomorrow afternoon.  She is on Lantus insulin for DM, with elevated CBGs since starting Decadron.  For perioperative management, to clarify the pre-op diet orders in order to determine insulin needs prior to surgery.  Also, to hold heparin/VTE prophylaxis prior to surgery as well. [name]

## 2011-06-09 ENCOUNTER — Encounter (HOSPITAL_COMMUNITY)
Admission: AD | Disposition: A | Discharge status: Home / Self Care | Payer: Self-pay | Source: Ambulatory Visit | Attending: Family Medicine

## 2011-06-09 ENCOUNTER — Inpatient Hospital Stay (HOSPITAL_COMMUNITY): Payer: BC Managed Care – PPO | Admitting: Anesthesiology

## 2011-06-09 ENCOUNTER — Other Ambulatory Visit: Payer: Self-pay | Admitting: Neurosurgery

## 2011-06-09 ENCOUNTER — Encounter (HOSPITAL_COMMUNITY): Payer: Self-pay | Admitting: Anesthesiology

## 2011-06-09 DIAGNOSIS — G43009 Migraine without aura, not intractable, without status migrainosus: Secondary | ICD-10-CM

## 2011-06-09 DIAGNOSIS — G936 Cerebral edema: Secondary | ICD-10-CM

## 2011-06-09 DIAGNOSIS — E119 Type 2 diabetes mellitus without complications: Secondary | ICD-10-CM

## 2011-06-09 DIAGNOSIS — I10 Essential (primary) hypertension: Secondary | ICD-10-CM

## 2011-06-09 HISTORY — PX: CRANIOTOMY: SHX93

## 2011-06-09 LAB — POCT I-STAT 7, (LYTES, BLD GAS, ICA,H+H)
Acid-base deficit: 2 mmol/L (ref 0.0–2.0)
Bicarbonate: 23.3 mEq/L (ref 20.0–24.0)
Calcium, Ion: 1.06 mmol/L — ABNORMAL LOW (ref 1.12–1.32)
HCT: 33 % — ABNORMAL LOW (ref 36.0–46.0)
Hemoglobin: 11.2 g/dL — ABNORMAL LOW (ref 12.0–15.0)
O2 Saturation: 100 %
Potassium: 4.7 mEq/L (ref 3.5–5.1)
Sodium: 133 mEq/L — ABNORMAL LOW (ref 135–145)
TCO2: 24 mmol/L (ref 0–100)
pCO2 arterial: 39.3 mmHg (ref 35.0–45.0)
pH, Arterial: 7.38 (ref 7.350–7.400)
pO2, Arterial: 236 mmHg — ABNORMAL HIGH (ref 80.0–100.0)

## 2011-06-09 LAB — GLUCOSE, CAPILLARY
Glucose-Capillary: 266 mg/dL — ABNORMAL HIGH (ref 70–99)
Glucose-Capillary: 245 mg/dL — ABNORMAL HIGH (ref 70–99)
Glucose-Capillary: 181 mg/dL — ABNORMAL HIGH (ref 70–99)
Glucose-Capillary: 243 mg/dL — ABNORMAL HIGH (ref 70–99)
Glucose-Capillary: 245 mg/dL — ABNORMAL HIGH (ref 70–99)
Glucose-Capillary: 267 mg/dL — ABNORMAL HIGH (ref 70–99)

## 2011-06-09 LAB — CBC
HCT: 37.5 % (ref 36.0–46.0)
Hemoglobin: 12.5 g/dL (ref 12.0–15.0)
MCH: 27 pg (ref 26.0–34.0)
MCHC: 33.3 g/dL (ref 30.0–36.0)
MCV: 81 fL (ref 78.0–100.0)
Platelets: 430 10*3/uL — ABNORMAL HIGH (ref 150–400)
RBC: 4.63 MIL/uL (ref 3.87–5.11)
RDW: 13.8 % (ref 11.5–15.5)
WBC: 14.5 10*3/uL — ABNORMAL HIGH (ref 4.0–10.5)

## 2011-06-09 LAB — MRSA PCR SCREENING: MRSA by PCR: NEGATIVE

## 2011-06-09 LAB — POCT I-STAT GLUCOSE
Glucose, Bld: 204 mg/dL — ABNORMAL HIGH (ref 70–99)
Operator id: 117071

## 2011-06-09 SURGERY — CRANIOTOMY TUMOR EXCISION
Anesthesia: General | Wound class: Clean Contaminated

## 2011-06-09 MED ORDER — DEXAMETHASONE SODIUM PHOSPHATE 10 MG/ML IJ SOLN
INTRAMUSCULAR | Status: DC | PRN
Start: 2011-06-09 — End: 2011-06-09
  Administered 2011-06-09: 10 mg via INTRAVENOUS

## 2011-06-09 MED ORDER — DEXTROSE 5 % IV SOLN
INTRAVENOUS | Status: DC | PRN
  Administered 2011-06-09: 13:00:00 via INTRAVENOUS

## 2011-06-09 MED ORDER — GLYCOPYRROLATE 0.2 MG/ML IJ SOLN
INTRAMUSCULAR | Status: DC | PRN
  Administered 2011-06-09 (×2): .4 mg via INTRAVENOUS

## 2011-06-09 MED ORDER — HYDRALAZINE HCL 20 MG/ML IJ SOLN
INTRAMUSCULAR | Status: DC | PRN
  Administered 2011-06-09: 5 mg via INTRAVENOUS

## 2011-06-09 MED ORDER — THROMBIN 20000 UNITS EX KIT
PACK | CUTANEOUS | Status: DC | PRN
  Administered 2011-06-09: 14:00:00 via TOPICAL

## 2011-06-09 MED ORDER — METOCLOPRAMIDE HCL 5 MG/ML IJ SOLN
10.0000 mg | Freq: Once | INTRAMUSCULAR | Status: DC | PRN
  Filled 2011-06-09: qty 2

## 2011-06-09 MED ORDER — DULOXETINE HCL 30 MG PO CPEP
30.0000 mg | ORAL_CAPSULE | Freq: Every day | ORAL | Status: DC
  Administered 2011-06-10 – 2011-06-13 (×4): 30 mg via ORAL
  Filled 2011-06-09 (×4): qty 1

## 2011-06-09 MED ORDER — PROPOFOL 10 MG/ML IV EMUL
INTRAVENOUS | Status: DC | PRN
  Administered 2011-06-09: 150 mg via INTRAVENOUS
  Administered 2011-06-09: 50 mg via INTRAVENOUS

## 2011-06-09 MED ORDER — VECURONIUM BROMIDE 10 MG IV SOLR
INTRAVENOUS | Status: DC | PRN
  Administered 2011-06-09: 2 mg via INTRAVENOUS
  Administered 2011-06-09 (×2): 1 mg via INTRAVENOUS
  Administered 2011-06-09: 2 mg via INTRAVENOUS
  Administered 2011-06-09: 1 mg via INTRAVENOUS
  Administered 2011-06-09 (×3): 2 mg via INTRAVENOUS

## 2011-06-09 MED ORDER — MORPHINE SULFATE 2 MG/ML IJ SOLN
2.0000 mg | INTRAMUSCULAR | Status: DC | PRN
  Administered 2011-06-10 (×2): 2 mg via INTRAVENOUS
  Administered 2011-06-10: 4 mg via INTRAVENOUS
  Filled 2011-06-09 (×2): qty 1
  Filled 2011-06-09: qty 2

## 2011-06-09 MED ORDER — TOPIRAMATE 100 MG PO TABS
100.0000 mg | ORAL_TABLET | Freq: Two times a day (BID) | ORAL | Status: DC
  Administered 2011-06-10 – 2011-06-13 (×7): 100 mg via ORAL
  Filled 2011-06-09 (×9): qty 1

## 2011-06-09 MED ORDER — FENTANYL CITRATE 0.05 MG/ML IJ SOLN
INTRAMUSCULAR | Status: DC | PRN
  Administered 2011-06-09: 50 ug via INTRAVENOUS
  Administered 2011-06-09 (×3): 100 ug via INTRAVENOUS
  Administered 2011-06-09: 50 ug via INTRAVENOUS
  Administered 2011-06-09 (×2): 100 ug via INTRAVENOUS

## 2011-06-09 MED ORDER — LACTATED RINGERS IV SOLN: INTRAVENOUS | Status: DC | PRN

## 2011-06-09 MED ORDER — CEFAZOLIN SODIUM 1-5 GM-% IV SOLN
INTRAVENOUS | Status: AC
  Administered 2011-06-09: 1 g via INTRAVENOUS
  Filled 2011-06-09: qty 50

## 2011-06-09 MED ORDER — HYDROCODONE-ACETAMINOPHEN 5-325 MG PO TABS
1.0000 | ORAL_TABLET | ORAL | Status: DC | PRN
  Administered 2011-06-10 (×2): 2 via ORAL
  Administered 2011-06-10: 1 via ORAL
  Administered 2011-06-11 – 2011-06-13 (×9): 2 via ORAL
  Filled 2011-06-09 (×6): qty 2
  Filled 2011-06-09: qty 1
  Filled 2011-06-09 (×4): qty 2

## 2011-06-09 MED ORDER — CLINDAMYCIN PHOSPHATE 900 MG/50ML IV SOLN
900.0000 mg | Freq: Once | INTRAVENOUS | Status: DC
  Filled 2011-06-09: qty 50

## 2011-06-09 MED ORDER — ROCURONIUM BROMIDE 100 MG/10ML IV SOLN
INTRAVENOUS | Status: DC | PRN
  Administered 2011-06-09: 20 mg via INTRAVENOUS
  Administered 2011-06-09: 50 mg via INTRAVENOUS
  Administered 2011-06-09: 20 mg via INTRAVENOUS
  Administered 2011-06-09: 10 mg via INTRAVENOUS

## 2011-06-09 MED ORDER — CLINDAMYCIN PHOSPHATE 900 MG/50ML IV SOLN
900.0000 mg | INTRAVENOUS | Status: DC
  Filled 2011-06-09: qty 50

## 2011-06-09 MED ORDER — 0.9 % SODIUM CHLORIDE (POUR BTL) OPTIME
TOPICAL | Status: DC | PRN
  Administered 2011-06-09: 1000 mL

## 2011-06-09 MED ORDER — SODIUM CHLORIDE 0.9 % IV SOLN
INTRAVENOUS | Status: DC | PRN
  Administered 2011-06-09 (×3): via INTRAVENOUS

## 2011-06-09 MED ORDER — MICROFIBRILLAR COLL HEMOSTAT EX PADS
MEDICATED_PAD | CUTANEOUS | Status: DC | PRN
  Administered 2011-06-09: 1 via TOPICAL

## 2011-06-09 MED ORDER — LABETALOL HCL 5 MG/ML IV SOLN
INTRAVENOUS | Status: DC | PRN
  Administered 2011-06-09: 5 mg via INTRAVENOUS
  Administered 2011-06-09: 10 mg via INTRAVENOUS
  Administered 2011-06-09: 5 mg via INTRAVENOUS

## 2011-06-09 MED ORDER — LABETALOL HCL 5 MG/ML IV SOLN
10.0000 mg | INTRAVENOUS | Status: DC | PRN
  Filled 2011-06-09: qty 4

## 2011-06-09 MED ORDER — FENTANYL CITRATE 0.05 MG/ML IJ SOLN
25.0000 ug | INTRAMUSCULAR | Status: DC | PRN
  Administered 2011-06-09: 50 ug via INTRAVENOUS

## 2011-06-09 MED ORDER — MANNITOL 25 % IV SOLN
INTRAVENOUS | Status: DC | PRN
  Administered 2011-06-09: 37.5 g via INTRAVENOUS

## 2011-06-09 MED ORDER — ONDANSETRON HCL 4 MG/2ML IJ SOLN: 2.0000 mg | Freq: Four times a day (QID) | INTRAMUSCULAR | Status: DC | PRN

## 2011-06-09 MED ORDER — LINAGLIPTIN 5 MG PO TABS
5.0000 mg | ORAL_TABLET | Freq: Every day | ORAL | Status: DC
  Administered 2011-06-10: 5 mg via ORAL
  Filled 2011-06-09 (×2): qty 1

## 2011-06-09 MED ORDER — ASPIRIN EC 81 MG PO TBEC
81.0000 mg | DELAYED_RELEASE_TABLET | Freq: Every day | ORAL | Status: DC
  Administered 2011-06-11 – 2011-06-13 (×3): 81 mg via ORAL
  Filled 2011-06-09 (×3): qty 1

## 2011-06-09 MED ORDER — SODIUM CHLORIDE 0.9 % IV SOLN: INTRAVENOUS | Status: DC

## 2011-06-09 MED ORDER — CLINDAMYCIN PHOSPHATE 600 MG/50ML IV SOLN
INTRAVENOUS | Status: DC | PRN
  Administered 2011-06-09: 900 mg via INTRAVENOUS

## 2011-06-09 MED ORDER — ALBUMIN HUMAN 5 % IV SOLN
INTRAVENOUS | Status: DC | PRN
  Administered 2011-06-09: 13:00:00 via INTRAVENOUS

## 2011-06-09 MED ORDER — MORPHINE SULFATE 2 MG/ML IJ SOLN: 0.0500 mg/kg | INTRAMUSCULAR | Status: DC | PRN

## 2011-06-09 MED ORDER — POTASSIUM CHLORIDE IN NACL 20-0.9 MEQ/L-% IV SOLN
INTRAVENOUS | Status: DC
  Administered 2011-06-09 – 2011-06-10 (×2): via INTRAVENOUS
  Filled 2011-06-09 (×5): qty 1000

## 2011-06-09 MED ORDER — NEOSTIGMINE METHYLSULFATE 1 MG/ML IJ SOLN
INTRAMUSCULAR | Status: DC | PRN
  Administered 2011-06-09: 2 mg via INTRAVENOUS

## 2011-06-09 MED ORDER — OLMESARTAN MEDOXOMIL 20 MG PO TABS
20.0000 mg | ORAL_TABLET | Freq: Every day | ORAL | Status: DC
  Filled 2011-06-09 (×2): qty 1

## 2011-06-09 MED ORDER — HYDROCHLOROTHIAZIDE 12.5 MG PO CAPS
12.5000 mg | ORAL_CAPSULE | Freq: Every day | ORAL | Status: DC
  Administered 2011-06-10 – 2011-06-11 (×2): 12.5 mg via ORAL
  Filled 2011-06-09 (×2): qty 1

## 2011-06-09 MED ORDER — SITAGLIPTIN PHOS-METFORMIN HCL 50-1000 MG PO TABS: 1.0000 | ORAL_TABLET | Freq: Two times a day (BID) | ORAL | Status: DC

## 2011-06-09 MED ORDER — VALSARTAN-HYDROCHLOROTHIAZIDE 160-12.5 MG PO TABS: 1.0000 | ORAL_TABLET | Freq: Every day | ORAL | Status: DC

## 2011-06-09 MED ORDER — METFORMIN HCL 500 MG PO TABS
1000.0000 mg | ORAL_TABLET | Freq: Two times a day (BID) | ORAL | Status: DC
  Administered 2011-06-10: 1000 mg via ORAL
  Filled 2011-06-09 (×3): qty 2

## 2011-06-09 MED ORDER — VITAMIN D3 25 MCG (1000 UNIT) PO TABS
2000.0000 [IU] | ORAL_TABLET | Freq: Two times a day (BID) | ORAL | Status: DC
  Administered 2011-06-10 – 2011-06-13 (×7): 2000 [IU] via ORAL
  Filled 2011-06-09 (×9): qty 2

## 2011-06-09 MED ORDER — MIDAZOLAM HCL 5 MG/5ML IJ SOLN
INTRAMUSCULAR | Status: DC | PRN
  Administered 2011-06-09: 1 mg via INTRAVENOUS

## 2011-06-09 MED ORDER — SODIUM CHLORIDE 0.9 % IR SOLN
Status: DC | PRN
  Administered 2011-06-09: 1000 mL

## 2011-06-09 MED ORDER — INSULIN GLARGINE 100 UNIT/ML ~~LOC~~ SOLN
26.0000 [IU] | SUBCUTANEOUS | Status: DC
  Administered 2011-06-10: 26 [IU] via SUBCUTANEOUS
  Filled 2011-06-09: qty 3

## 2011-06-09 MED ORDER — DEXAMETHASONE 6 MG PO TABS
6.0000 mg | ORAL_TABLET | Freq: Four times a day (QID) | ORAL | Status: DC
  Administered 2011-06-10 – 2011-06-11 (×5): 6 mg via ORAL
  Filled 2011-06-09 (×8): qty 1

## 2011-06-09 SURGICAL SUPPLY — 122 items
BANDAGE GAUZE 4  KLING STR (GAUZE/BANDAGES/DRESSINGS) ×2 IMPLANT
BANDAGE GAUZE ELAST BULKY 4 IN (GAUZE/BANDAGES/DRESSINGS) IMPLANT
BENZOIN TINCTURE PRP APPL 2/3 (GAUZE/BANDAGES/DRESSINGS) ×2 IMPLANT
BLADE SAW GIGLI 16 STRL (MISCELLANEOUS) IMPLANT
BLADE SURG 15 STRL LF DISP TIS (BLADE) IMPLANT
BLADE SURG 15 STRL SS (BLADE)
BLADE SURG ROTATE 9660 (MISCELLANEOUS) ×2 IMPLANT
BLADE ULTRA TIP 2M (BLADE) ×2 IMPLANT
BRUSH SCRUB EZ 1% IODOPHOR (MISCELLANEOUS) IMPLANT
BUR ACORN 6.0 PRECISION (BURR) ×2 IMPLANT
BUR ADDG 1.1 (BURR) IMPLANT
BUR MATCHSTICK NEURO 3.0 LAGG (BURR) IMPLANT
BUR ROUTER D-58 CRANI (BURR) ×2 IMPLANT
CANISTER SUCTION 2500CC (MISCELLANEOUS) ×2 IMPLANT
CATH VENTRIC 35X38 W/TROCAR LG (CATHETERS) IMPLANT
CLIP RANEY DISP (INSTRUMENTS) ×6 IMPLANT
CLIP TI MEDIUM 6 (CLIP) IMPLANT
CLOTH BEACON ORANGE TIMEOUT ST (SAFETY) ×2 IMPLANT
CONT SPEC 4OZ CLIKSEAL STRL BL (MISCELLANEOUS) ×6 IMPLANT
CORDS BIPOLAR (ELECTRODE) ×2 IMPLANT
COVER MAYO STAND STRL (DRAPES) IMPLANT
DECANTER SPIKE VIAL GLASS SM (MISCELLANEOUS) ×2 IMPLANT
DRAIN SNY WOU 7FLT (WOUND CARE) IMPLANT
DRAIN SUBARACHNOID (WOUND CARE) IMPLANT
DRAPE CAMERA VIDEO/LASER (DRAPES) IMPLANT
DRAPE LONG LASER MIC (DRAPES) IMPLANT
DRAPE MICROSCOPE LEICA (MISCELLANEOUS) ×2 IMPLANT
DRAPE NEUROLOGICAL W/INCISE (DRAPES) ×2 IMPLANT
DRAPE STERI IOBAN 125X83 (DRAPES) IMPLANT
DRAPE SURG 17X23 STRL (DRAPES) ×2 IMPLANT
DRAPE WARM FLUID 44X44 (DRAPE) ×2 IMPLANT
DRESSING TELFA 8X3 (GAUZE/BANDAGES/DRESSINGS) ×2 IMPLANT
DURAGUARD 06CMX08CM ×2 IMPLANT
DURAPREP 6ML APPLICATOR 50/CS (WOUND CARE) IMPLANT
DURASEAL 3ML ×2 IMPLANT
ELECT CAUTERY BLADE 6.4 (BLADE) ×4 IMPLANT
ELECT REM PT RETURN 9FT ADLT (ELECTROSURGICAL) ×2
ELECTRODE REM PT RTRN 9FT ADLT (ELECTROSURGICAL) ×1 IMPLANT
EVACUATOR 1/8 PVC DRAIN (DRAIN) IMPLANT
EVACUATOR SILICONE 100CC (DRAIN) IMPLANT
FORCEPS BIPOLAR SPETZLER 8 1.0 (NEUROSURGERY SUPPLIES) ×2 IMPLANT
GAUZE KERLIX 2  STERILE LF (GAUZE/BANDAGES/DRESSINGS) ×2 IMPLANT
GAUZE SPONGE 4X4 16PLY XRAY LF (GAUZE/BANDAGES/DRESSINGS) ×2 IMPLANT
GLOVE BIO SURGEON STRL SZ 6.5 (GLOVE) IMPLANT
GLOVE BIO SURGEON STRL SZ7 (GLOVE) IMPLANT
GLOVE BIO SURGEON STRL SZ7.5 (GLOVE) IMPLANT
GLOVE BIO SURGEON STRL SZ8 (GLOVE) IMPLANT
GLOVE BIO SURGEON STRL SZ8.5 (GLOVE) IMPLANT
GLOVE BIOGEL M 8.0 STRL (GLOVE) IMPLANT
GLOVE BIOGEL PI IND STRL 7.5 (GLOVE) ×1 IMPLANT
GLOVE BIOGEL PI IND STRL 8 (GLOVE) ×1 IMPLANT
GLOVE BIOGEL PI IND STRL 8.5 (GLOVE) ×2 IMPLANT
GLOVE BIOGEL PI INDICATOR 7.5 (GLOVE) ×1
GLOVE BIOGEL PI INDICATOR 8 (GLOVE) ×1
GLOVE BIOGEL PI INDICATOR 8.5 (GLOVE) ×2
GLOVE ECLIPSE 6.5 STRL STRAW (GLOVE) ×2 IMPLANT
GLOVE ECLIPSE 7.0 STRL STRAW (GLOVE) ×2 IMPLANT
GLOVE ECLIPSE 7.5 STRL STRAW (GLOVE) ×2 IMPLANT
GLOVE ECLIPSE 8.0 STRL XLNG CF (GLOVE) IMPLANT
GLOVE ECLIPSE 8.5 STRL (GLOVE) ×4 IMPLANT
GLOVE EXAM NITRILE LRG STRL (GLOVE) IMPLANT
GLOVE EXAM NITRILE MD LF STRL (GLOVE) ×4 IMPLANT
GLOVE EXAM NITRILE XL STR (GLOVE) IMPLANT
GLOVE EXAM NITRILE XS STR PU (GLOVE) IMPLANT
GLOVE INDICATOR 6.5 STRL GRN (GLOVE) IMPLANT
GLOVE INDICATOR 7.0 STRL GRN (GLOVE) IMPLANT
GLOVE INDICATOR 7.5 STRL GRN (GLOVE) IMPLANT
GLOVE INDICATOR 8.0 STRL GRN (GLOVE) IMPLANT
GLOVE INDICATOR 8.5 STRL (GLOVE) IMPLANT
GLOVE OPTIFIT SS 8.0 STRL (GLOVE) IMPLANT
GLOVE SURG SS PI 6.5 STRL IVOR (GLOVE) ×6 IMPLANT
GLOVE SURG SS PI 8.0 STRL IVOR (GLOVE) ×4 IMPLANT
GOWN BRE IMP SLV AUR LG STRL (GOWN DISPOSABLE) ×4 IMPLANT
GOWN BRE IMP SLV AUR XL STRL (GOWN DISPOSABLE) ×2 IMPLANT
GOWN STRL REIN 2XL LVL4 (GOWN DISPOSABLE) ×4 IMPLANT
HEMOSTAT SURGICEL 2X14 (HEMOSTASIS) ×2 IMPLANT
HOOK DURA (MISCELLANEOUS) ×2 IMPLANT
KIT BASIN OR (CUSTOM PROCEDURE TRAY) ×2 IMPLANT
KIT DRAIN CSF ACCUDRAIN (MISCELLANEOUS) IMPLANT
KIT ROOM TURNOVER OR (KITS) ×2 IMPLANT
NEEDLE HYPO 25X1 1.5 SAFETY (NEEDLE) ×2 IMPLANT
NEEDLE SPNL 18GX3.5 QUINCKE PK (NEEDLE) IMPLANT
NS IRRIG 1000ML POUR BTL (IV SOLUTION) ×2 IMPLANT
PACK CRANIOTOMY (CUSTOM PROCEDURE TRAY) ×2 IMPLANT
PAD EYE OVAL STERILE LF (GAUZE/BANDAGES/DRESSINGS) IMPLANT
PATTIES SURGICAL .25X.25 (GAUZE/BANDAGES/DRESSINGS) IMPLANT
PATTIES SURGICAL .5 X.5 (GAUZE/BANDAGES/DRESSINGS) IMPLANT
PATTIES SURGICAL .5 X3 (DISPOSABLE) ×4 IMPLANT
PATTIES SURGICAL 1/4 X 3 (GAUZE/BANDAGES/DRESSINGS) IMPLANT
PATTIES SURGICAL 1X1 (DISPOSABLE) IMPLANT
PENCIL BUTTON HOLSTER BLD 10FT (ELECTRODE) ×2 IMPLANT
PLATE 1.5  2HOLE LNG NEURO (Plate) ×4 IMPLANT
PLATE 1.5 2HOLE LNG NEURO (Plate) ×4 IMPLANT
RUBBERBAND STERILE (MISCELLANEOUS) ×4 IMPLANT
SCREW SELF DRILL HT 1.5/4MM (Screw) ×16 IMPLANT
SET TUBING W/EXT DISP (INSTRUMENTS) ×2 IMPLANT
SILK 1 MH ×4 IMPLANT
SPECIMEN JAR SMALL (MISCELLANEOUS) IMPLANT
SPONGE GAUZE 4X4 12PLY (GAUZE/BANDAGES/DRESSINGS) ×2 IMPLANT
SPONGE NEURO XRAY DETECT 1X3 (DISPOSABLE) IMPLANT
SPONGE SURGIFOAM ABS GEL 100 (HEMOSTASIS) ×2 IMPLANT
SPONGE SURGIFOAM ABS GEL SZ50 (HEMOSTASIS) ×2 IMPLANT
STAPLER VISISTAT 35W (STAPLE) ×4 IMPLANT
SUT ETHILON 3 0 FSL (SUTURE) IMPLANT
SUT ETHILON 3 0 PS 1 (SUTURE) IMPLANT
SUT NURALON 4 0 TR CR/8 (SUTURE) ×10 IMPLANT
SUT PL GUT 3 0 FS 1 (SUTURE) IMPLANT
SUT SILK 0 TIES 10X30 (SUTURE) IMPLANT
SUT STEEL 0 (SUTURE)
SUT STEEL 0 18XMFL TIE 17 (SUTURE) IMPLANT
SUT VIC AB 2-0 CT2 18 VCP726D (SUTURE) ×8 IMPLANT
SYR 20ML ECCENTRIC (SYRINGE) ×2 IMPLANT
SYR CONTROL 10ML LL (SYRINGE) ×2 IMPLANT
TAPE CLOTH 1X10 TAN NS (GAUZE/BANDAGES/DRESSINGS) ×2 IMPLANT
TIP SONASTAR STD MISONIX 1.9 (TRAY / TRAY PROCEDURE) IMPLANT
TIP STRAIGHT 25KHZ (INSTRUMENTS) ×2 IMPLANT
TOWEL OR 17X24 6PK STRL BLUE (TOWEL DISPOSABLE) ×2 IMPLANT
TOWEL OR 17X26 10 PK STRL BLUE (TOWEL DISPOSABLE) ×2 IMPLANT
TRAY FOLEY CATH 14FRSI W/METER (CATHETERS) ×2 IMPLANT
TUBE CONNECTING 12X1/4 (SUCTIONS) ×2 IMPLANT
UNDERPAD 30X30 INCONTINENT (UNDERPADS AND DIAPERS) ×2 IMPLANT
WATER STERILE IRR 1000ML POUR (IV SOLUTION) ×2 IMPLANT

## 2011-06-09 NOTE — Op Note (Signed)
06/03/2011 - 06/09/2011  6:44 PM  PATIENT:  Victoria Mullen  60 y.o. female  PRE-OPERATIVE DIAGNOSIS:  Planum Sphenoidale Meningioma.   POST-OPERATIVE DIAGNOSIS: Planum Sphenoidal Meningioma  PROCEDURE:  Procedure(s):Bifrontal CRANIOTOMY for TUMOR EXCISION Microdissection SURGEON:  Surgeon(s): Trina Ao Elsner  ASSISTANTS:elsner  ANESTHESIA:   general  EBL:  Total I/O In: 3800 [I.V.:3550; IV Piggyback:250] Out: 2300 [Urine:2100; Blood:200]  BLOOD ADMINISTERED:none  CELL SAVER GIVEN:none   COUNT:per nursing  DRAINS: none   SPECIMEN:  Source of Specimen:  Right frontal lobe  DICTATION: [name] was brought to the operating room intubated and placed under general anesthesia. A foley catheter was placed under sterile conditions. An arterial line was placed in the PACU.After adequate anesthesia was obtained I placed her head in a 3 pin [place] headholder to 60 lbs of pressure. She was positioned with her head straight up. I turned the bed 180 degrees. Her head was shaved and prepped in a sterile fashion. I infiltrated 0.5%lidocaine 1/200,000 epi into the scalp before opening the incision. I opened the incision with a no. 10 blade. I made a bicoronal incision starting anterior to the tragus on the right and ending anterior to the tragus on the left. I did not violate the temporalis fascia on opening. I reflected the scalp anteriorly exposing the orbital ridge bilaterally. I made a small opening in the temporalis muscle overlying the pterion bilaterally. I made burr holes in the pterions, then turned the flap with the craniotome, creating a bicoronal opening. I stayed anterior to the coronal suture, and low enough that I came through the frontal sinus bilaterally. After removing the flap I opened the dural next to the midline bilaterally. With the assistance of Dr. Danielle Dess we ligated and divided the superior sagittal sinus with 1-0 silk sutures. We divided the falx and  exposed the frontal lobes on the right and left . Using a hand held retractor I then exposed the tumor which was firm and greyish red along the planum sphenoidale.   I then started my dissection and resection of the tumor. The microscope was brought into the operative field. I used bipolar cautery to shrink the capsule and to provide an appropiate place to open the capsule. I opened the capsule and Dr. Danielle Dess was able to take samples for the pathologist. We used the Sonopet to hollow out the tumor, and to debulk it. Over time I was able to define the borders of the tumor along the skull base and the right frontal lobe. On the left the preop imaging strongly suggested that there was a cystic component on the left side in a superoposterior location. Dr. Danielle Dess moved a retractor that we had placed to aid in resection. When this retractor was manipulated(holding the left frontal lobe)there was a gush of fluid and tumor that entered the field. This part of the tumor was very soft and was easily removed with suction. Posterior to this there were translucent membranes which I did not violate. I felt that they were outside the tumor and could have been ventricular surfaces. Nevertheless I continued my dissection around the remaining capsule. I slowly removed the tumor from the planum, coagulating as I went. With time I got around the entire capsule and was able to gather it towards me. With near continuous coagulation th capsule continued to shrink in size. I used cottonoid patties to protect the brain surface and to help in dissection. I finally removed the remainder of the tumor. I  inspected my bony and cerebral surfaces and believed I achieved a gross total resection.  I irrigated copiously then started to close.  We raised a pericranial flap and brought that over the exposed frontal sinus suturing it to the dural edge. We then used a bovine pericardium graft to close the dural opening. We also secured the pericardial  graft to the pericranial  Graft. I coated both closures with duraseal. We then replaced the bone flap with plates and screws. I closed the scalp in layers, reapproximating the galea then scalp edges. She was extubated and moving all extremities. Preliminary pathology was meningioma  PLAN OF CARE: Admit to inpatient   PATIENT DISPOSITION:  PACU - hemodynamically stable.   Delay start of Pharmacological VTE agent (>24hrs) due to surgical blood loss or risk of bleeding:  yes

## 2011-06-09 NOTE — Anesthesia Postprocedure Evaluation (Signed)
Anesthesia Post Note  Patient: Victoria Mullen  Procedure(s) Performed:  CRANIOTOMY TUMOR EXCISION - Bifrontal Craniotomy for Excision of Tumor  Anesthesia type: General  Patient location: PACU  Post pain: Pain level controlled  Post assessment: Patient's Cardiovascular Status Stable  Last Vitals:  Filed Vitals:   06/09/11 1945  BP:   Pulse: 62  Temp:   Resp: 17    Post vital signs: Reviewed and stable  Level of consciousness: sedated  Complications: No apparent anesthesia complications

## 2011-06-09 NOTE — Anesthesia Procedure Notes (Addendum)
Procedure Name: Intubation Date/Time: 06/09/2011 12:18 PM Performed by: Wray Kearns A Pre-anesthesia Checklist: Patient identified, Timeout performed, Emergency Drugs available, Suction available and Patient being monitored Patient Re-evaluated:Patient Re-evaluated prior to inductionOxygen Delivery Method: Circle System Utilized Preoxygenation: Pre-oxygenation with 100% oxygen Intubation Type: IV induction Ventilation: Mask ventilation without difficulty Laryngoscope Size: Miller and 2 Grade View: Grade I Tube type: Oral Tube size: 7.5 mm Number of attempts: 1 Airway Equipment and Method: stylet Placement Confirmation: ETT inserted through vocal cords under direct vision,  breath sounds checked- equal and bilateral and positive ETCO2 Secured at: 21 cm Tube secured with: Tape Dental Injury: Teeth and Oropharynx as per pre-operative assessment

## 2011-06-09 NOTE — Preoperative (Signed)
Beta Blockers   Reason not to administer Beta Blockers:Not Applicable 

## 2011-06-09 NOTE — OR Nursing (Signed)
Unable to perform change of shift count.

## 2011-06-09 NOTE — Progress Notes (Signed)
Patient ID: MAHEEN CWIKLA, female   DOB: [birth year], 60 y.o.   MRN: [id number] Temp:  [97.1 F (36.2 C)-98.3 F (36.8 C)] 98.3 F (36.8 C) (12/20 1845) Pulse Rate:  [51-73] 62  (12/20 1945) Resp:  [17-23] 17  (12/20 1945) BP: (108-115)/(63-68) 115/68 mmHg (12/20 0925) SpO2:  [90 %-99 %] 91 % (12/20 1945) Arterial Line BP: (133-144)/(55-60) 133/55 mmHg (12/20 1945) Alert, oriented x4. Following all commands. Peerl,  Symmetric facies Tongue and uvula midline Moving all extremities Dressing intact. Doing well following craniotomy.

## 2011-06-09 NOTE — Progress Notes (Signed)
PGY-1 Daily Progress Note Family Medicine Teaching Service D. Piloto Rolene Arbour, MD Service Pager: [phone]  Patient name: Victoria Mullen  Medical record [id number] Date of birth:10-10-1950 Age: 60 y.o. Gender: female  LOS: 6 days   Subjective: No events overnight. No headaches.  Objective:  Vitals: Temp:  97.7 F (36.5 C) (12/20 0925) Pulse Rate: 51  (12/20 0925) Resp:  18  (12/20 0925) BP:  115/68 mmHg (12/20 0925) SpO2: 92 % (12/20 0925)  Physical Exam: General: alert, cooperative, appears stated age and no distress  HEENT: extra ocular movement intact, oropharynx clear, no lesions  Heart: RRR, no murmurs, gallops or rubs.  Lungs: Normal work of breathing. Clear breath sound bilaterally, no rales or wheezes.  Abdomen: Soft, no tender to palpation. Normal bowel sounds.  Extremities: No edema  Neurology: Normal without focal findings. Unchanged since admission.   Labs and imaging:  CBC  Lab 06/09/11 0212 06/08/11 1934 06/04/11 0740  WBC 14.5* 14.5* 12.5*  HGB 12.5 12.3 12.6  HCT 37.5 36.9 38.6  PLT 430* 448* 412*   BMET  Lab 06/03/11 1950  NA 135  K 3.7  CL 101  CO2 22  BUN 17  CREATININE 0.73  LABGLOM --  GLUCOSE 111*  CALCIUM 9.1    No results found. Medications: Medication Dose Route Frequency  . 0.9 %  sodium chloride infusion  250 mL Intravenous PRN  . 0.9 %  sodium chloride infusion   Intravenous Continuous  . ceFAZolin (ANCEF) IVPB 1 g/50 mL premix  1 g Intravenous 60 min Pre-Op  . dexamethasone (DECADRON) tablet 4 mg  4 mg Oral Q6H  . heparin injection 5,000 Units  5,000 Units Subcutaneous Q8H  . insulin aspart (novoLOG) injection 0-15 Units  0-15 Units Subcutaneous TID WC  . insulin glargine (LANTUS) injection 5 Units  5 Units Subcutaneous QHS   Assessment and Plan: 1: Brain Tumor: likely meningioma. neurologically unchanged Dr. Venetia Maxon with neurosurgery on board. Preop labs done.   -Surgery scheduled for today. -Continue Decadron 4mg  q 6  hours prn per Neurosurgery  -Continue neurochecks  2. DM  HGBA1C 6.2* 06/03/2011, CBG's from high 100's to low 200's. On Steroids.  -On SSI  - Lantus  Changed to 5 units for today PM pt was NPO. d. (At home she administer 26 units a day).  - Monitor and adjust dose of Lantus if needed.  3. HTN: Controlled. At Home on Valsartan and HCTZ.  -Monitor since Pt on steroids. Consider adding home meds if hypertension.  4. FENGI  - protonix, Heparin held since last night. 5. Dispo:   Will go to surgery today.   D. Piloto Rolene Arbour, MD PGY1, [facility] Medicine Teaching Service Pager [phone] 06/09/2011

## 2011-06-09 NOTE — Transfer of Care (Signed)
Immediate Anesthesia Transfer of Care Note  Patient: Victoria Mullen  Procedure(s) Performed:  CRANIOTOMY TUMOR EXCISION - Bifrontal Craniotomy for Excision of Tumor  Patient Location: PACU  Anesthesia Type: General  Level of Consciousness: sedated  Airway & Oxygen Therapy: Patient Spontanous Breathing and Patient connected to nasal cannula oxygen  Post-op Assessment: Report given to PACU RN  Post vital signs: stable  Complications: No apparent anesthesia complications

## 2011-06-09 NOTE — Anesthesia Preprocedure Evaluation (Addendum)
Anesthesia Evaluation  Patient identified by MRN, date of birth, ID band Patient awake    Reviewed: Allergy & Precautions, H&P , NPO status   Airway Mallampati: II      Dental   Pulmonary  clear to auscultation        Cardiovascular Exercise Tolerance: Good hypertension, Pt. on medications - dysrhythmias Regular Normal    Neuro/Psych  Headaches,    GI/Hepatic negative GI ROS, Neg liver ROS,   Endo/Other  Diabetes mellitus-  Renal/GU negative Renal ROS     Musculoskeletal negative musculoskeletal ROS (+)   Abdominal   Peds  Hematology   Anesthesia Other Findings   Reproductive/Obstetrics                          Anesthesia Physical Anesthesia Plan  ASA: III  Anesthesia Plan: General   Post-op Pain Management:    Induction: Intravenous  Airway Management Planned: Oral ETT  Additional Equipment: Arterial line  Intra-op Plan:   Post-operative Plan: Possible Post-op intubation/ventilation  Informed Consent:   Plan Discussed with: CRNA  Anesthesia Plan Comments:         Anesthesia Quick Evaluation

## 2011-06-10 ENCOUNTER — Inpatient Hospital Stay (HOSPITAL_COMMUNITY): Payer: BC Managed Care – PPO

## 2011-06-10 ENCOUNTER — Encounter (HOSPITAL_COMMUNITY): Payer: Self-pay | Admitting: Neurosurgery

## 2011-06-10 LAB — GLUCOSE, CAPILLARY
Glucose-Capillary: 234 mg/dL — ABNORMAL HIGH (ref 70–99)
Glucose-Capillary: 220 mg/dL — ABNORMAL HIGH (ref 70–99)
Glucose-Capillary: 209 mg/dL — ABNORMAL HIGH (ref 70–99)
Glucose-Capillary: 115 mg/dL — ABNORMAL HIGH (ref 70–99)

## 2011-06-10 MED ORDER — SODIUM CHLORIDE 0.9 % IV SOLN
500.0000 mg | Freq: Two times a day (BID) | INTRAVENOUS | Status: DC
  Administered 2011-06-10: 500 mg via INTRAVENOUS
  Filled 2011-06-10 (×6): qty 5

## 2011-06-10 MED ORDER — IOHEXOL 300 MG/ML  SOLN
75.0000 mL | Freq: Once | INTRAMUSCULAR | Status: AC | PRN
  Administered 2011-06-10: 75 mL via INTRAVENOUS

## 2011-06-10 MED ORDER — LEVETIRACETAM 500 MG PO TABS
500.0000 mg | ORAL_TABLET | Freq: Two times a day (BID) | ORAL | Status: DC
  Administered 2011-06-10 – 2011-06-13 (×6): 500 mg via ORAL
  Filled 2011-06-10 (×8): qty 1

## 2011-06-10 NOTE — Progress Notes (Signed)
PGY-1 Daily Progress Note Family Medicine Teaching Service D. Piloto Rolene Arbour, MD Service Pager: [phone]  Patient name: Victoria Mullen  Medical record [id number] Date of birth:1951-01-14 Age: 60 y.o. Gender: female  LOS: 7 days   Subjective: Post op day 1: Pt tolerating clears. Has headache 5/10. Afebrile and rest of vitals are WNL.  Objective:  Vitals: Temp: 98.5 F (36.9 C) (12/21 0800) Pulse Rate:  71  (12/21 0800) Resp:   13  (12/21 0800) BP: 115/54 mmHg (12/21 0800) SpO2:   96 % (12/21 0800) with O2  Arterial Line BP: (133-144)/(55-60) 133/55 mmHg (12/20 1945) Weight:  [173 lb 11.6 oz (78.8 kg)] 173 lb 11.6 oz (78.8 kg) (12/20 2015)  Intake/Output Summary (Last 24 hours) at 06/10/11 1122 Last data filed at 06/10/11 0800  Gross per 24 hour  Intake 4866.67 ml  Output   3385 ml  Net 1481.67 ml   Physical Exam: General: alert, cooperative, and no distress  HEENT: Head with dressings after surgery. Facial edema. Postsurgical.  Heart: RRR, no murmurs, gallops or rubs.  Lungs: Normal work of breathing. Clear breath sound bilaterally, no rales or wheezes.  Abdomen: Soft, no tender to palpation. Normal bowel sounds.  Extremities: Mild edema. Neurology: No focal findings  Labs and imaging:  CBC  Lab 06/09/11 1502 06/09/11 0212 06/08/11 1934 06/04/11 0740  WBC -- 14.5* 14.5* 12.5*  HGB 11.2* 12.5 12.3 --  HCT 33.0* 37.5 36.9 --  PLT -- 430* 448* 412*    Results for orders placed during the hospital encounter of 06/03/11 (from the past 24 hour(s))  POCT I-STAT 7, (LYTES, BLD GAS, ICA,H+H)     Status: Abnormal   Collection Time   06/09/11  3:02 PM      Component Value Range   pH, Arterial 7.380  7.350 - 7.400    pCO2 arterial 39.3  35.0 - 45.0 (mmHg)   pO2, Arterial 236.0 (*) 80.0 - 100.0 (mmHg)   Bicarbonate 23.3  20.0 - 24.0 (mEq/L)   TCO2 24  0 - 100 (mmol/L)   O2 Saturation 100.0     Acid-base deficit 2.0  0.0 - 2.0 (mmol/L)   Sodium 133 (*) 135 - 145  (mEq/L)   Potassium 4.7  3.5 - 5.1 (mEq/L)   Calcium, Ion 1.06 (*) 1.12 - 1.32 (mmol/L)   HCT 33.0 (*) 36.0 - 46.0 (%)   Hemoglobin 11.2 (*) 12.0 - 15.0 (g/dL)   Sample type ARTERIAL    POCT I-STAT GLUCOSE     Status: Abnormal   Collection Time   06/09/11  3:06 PM      Component Value Range   Operator id [id number]     Glucose, Bld 204 (*) 70 - 99 (mg/dL)  GLUCOSE, CAPILLARY     Status: Abnormal   Collection Time   06/09/11  6:58 PM      Component Value Range   Glucose-Capillary 245 (*) 70 - 99 (mg/dL)   Comment 1 Documented in Chart     Comment 2 Notify RN    GLUCOSE, CAPILLARY     Status: Abnormal   Collection Time   06/09/11  7:05 PM      Component Value Range   Glucose-Capillary 243 (*) 70 - 99 (mg/dL)  MRSA PCR SCREENING     Status: Normal   Collection Time   06/09/11  8:40 PM      Component Value Range   MRSA by PCR NEGATIVE  NEGATIVE   GLUCOSE, CAPILLARY  Status: Abnormal   Collection Time   06/09/11 10:08 PM      Component Value Range   Glucose-Capillary 267 (*) 70 - 99 (mg/dL)    Medications:  Assessment and Plan: Brain Tumor: likely meningioma. Was surgically intervened yesterday. Good recuperation on day 1.  Neurosurgery managing. 2. DM  HGBA1C 6.2* 06/03/2011, CBG's on the mid 200's. On Steroids.  - SSI  - Lantus . (At home she gets 26 units a day). Pt's CBG's have been stable even with the steroids treatment. We are concerned that with the change in diet going back to her home dose may be too agressive. On the other hand Metformin is a concern if for some reason pt needs a contrast study. - Monitor and start  Lantus if needed.  3. HTN: Controlled. At Home on Valsartan and HCTZ. No antihypertensive therapy needed during hospitalization. -Monitor since Pt on steroids. Consider adding home meds if hypertension.  4. FEN/GI: Clears today. We will differ this to the surgical team as well as DVT prophylaxis. 5. Dispo:  Pending improvement.    D. Piloto Rolene Arbour, MD PGY1, [facility] Medicine Teaching Service Pager [phone] 06/10/2011

## 2011-06-10 NOTE — Progress Notes (Signed)
Inpatient Diabetes Program Recommendations  AACE/ADA: New Consensus Statement on Inpatient Glycemic Control (2009)  Target Ranges:  Prepandial:   less than 140 mg/dL      Peak postprandial:   less than 180 mg/dL (1-2 hours)      Critically ill patients:  140 - 180 mg/dL   CBGs today: 621/ 308 mg/dl  Inpatient Diabetes Program Recommendations Insulin - Basal: Noted Lantus 26 units daily started today.  May need to titrate upward while on Decadron- Lantus 30 units daily in AM. Correction (SSI): Please add HS scale coverage to current SSI regimen. Insulin - Meal Coverage: x Oral Agents: x Diet: x

## 2011-06-11 LAB — GLUCOSE, CAPILLARY
Glucose-Capillary: 203 mg/dL — ABNORMAL HIGH (ref 70–99)
Glucose-Capillary: 228 mg/dL — ABNORMAL HIGH (ref 70–99)
Glucose-Capillary: 210 mg/dL — ABNORMAL HIGH (ref 70–99)
Glucose-Capillary: 268 mg/dL — ABNORMAL HIGH (ref 70–99)

## 2011-06-11 LAB — BASIC METABOLIC PANEL
BUN: 12 mg/dL (ref 6–23)
CO2: 21 mEq/L (ref 19–32)
Calcium: 8.9 mg/dL (ref 8.4–10.5)
Chloride: 100 mEq/L (ref 96–112)
Creatinine, Ser: 0.58 mg/dL (ref 0.50–1.10)
GFR calc Af Amer: >90 mL/min (ref >90)
GFR calc non Af Amer: >90 mL/min (ref >90)
Glucose, Bld: 215 mg/dL — ABNORMAL HIGH (ref 70–99)
Potassium: 4.2 mEq/L (ref 3.5–5.1)
Sodium: 130 mEq/L — ABNORMAL LOW (ref 135–145)

## 2011-06-11 LAB — CBC
HCT: 30.6 % — ABNORMAL LOW (ref 36.0–46.0)
Hemoglobin: 10.1 g/dL — ABNORMAL LOW (ref 12.0–15.0)
MCH: 27 pg (ref 26.0–34.0)
MCHC: 33 g/dL (ref 30.0–36.0)
MCV: 81.8 fL (ref 78.0–100.0)
Platelets: 342 10*3/uL (ref 150–400)
RBC: 3.74 MIL/uL — ABNORMAL LOW (ref 3.87–5.11)
RDW: 14.2 % (ref 11.5–15.5)
WBC: 21.1 10*3/uL — ABNORMAL HIGH (ref 4.0–10.5)

## 2011-06-11 MED ORDER — DEXAMETHASONE 4 MG PO TABS
4.0000 mg | ORAL_TABLET | Freq: Two times a day (BID) | ORAL | Status: DC
  Administered 2011-06-11 – 2011-06-13 (×4): 4 mg via ORAL
  Filled 2011-06-11 (×6): qty 1

## 2011-06-11 MED ORDER — DEXAMETHASONE 4 MG PO TABS
4.0000 mg | ORAL_TABLET | Freq: Four times a day (QID) | ORAL | Status: DC
  Filled 2011-06-11 (×2): qty 1

## 2011-06-11 MED ORDER — INSULIN GLARGINE 100 UNIT/ML ~~LOC~~ SOLN
26.0000 [IU] | SUBCUTANEOUS | Status: DC
  Administered 2011-06-12: 26 [IU] via SUBCUTANEOUS

## 2011-06-11 MED ORDER — INSULIN GLARGINE 100 UNIT/ML ~~LOC~~ SOLN
20.0000 [IU] | SUBCUTANEOUS | Status: DC
  Administered 2011-06-11: 20 [IU] via SUBCUTANEOUS

## 2011-06-11 NOTE — Progress Notes (Signed)
FMTS Daily R3 Progress Note  Subjective: doing well this AM.  Hungry.  Comfortable this AM.  I have reviewed the patient's medications.  Objective Temp:  [97.7 F (36.5 C)-98.5 F (36.9 C)] 97.7 F (36.5 C) (12/22 0800) Pulse Rate:  [55-85] 73  (12/22 0900) Resp:  [14-26] 17  (12/22 0900) BP: (102-128)/(49-75) 117/61 mmHg (12/22 0600) SpO2:  [91 %-97 %] 96 % (12/22 0900)   Intake/Output Summary (Last 24 hours) at 06/11/11 1044 Last data filed at 06/11/11 1000  Gross per 24 hour  Intake   2680 ml  Output    350 ml  Net   2330 ml    CBG (last 3)   Basename 06/10/11 1652 06/10/11 1213 06/10/11 0831  GLUCAP 115* 209* 220*    General: comfortable appearing CV: RRR no murmur Pulm: CTAB Abd: soft and NT Ext: warm no edema   Labs and Imaging  Lab 06/09/11 1502 06/09/11 0212 06/08/11 1934  WBC -- 14.5* 14.5*  HGB 11.2* 12.5 12.3  HCT 33.0* 37.5 36.9  PLT -- 430* 448*     Lab 06/09/11 1506 06/09/11 1502  NA -- 133*  K -- 4.7  CL -- --  CO2 -- --  BUN -- --  CREATININE -- --  LABGLOM -- --  GLUCOSE 204* --  CALCIUM -- --       Assessment and Plan 1. Brain Tumor: likely meningioma. POD 2. Good recuperation.  Neurosurgery managing. 2. DM  HGBA1C 6.2* 06/03/2011, CBG's on the mid 200's. On Steroids.  - SSI  - Lantus . Advance to home dose of lantus while monitoring am fasting CBGs.   - Monitor and start  Lantus if needed.  3. HTN: Controlled. At Home on Valsartan and HCTZ. No antihypertensive therapy needed during hospitalization due to normal BPs.   -Monitor since Pt on steroids. Consider adding home meds if hypertension.  4. FEN/GI: ADAT. We will differ this to the surgical team as well as DVT prophylaxis. 5. Dispo:  Pending improvement.   Haylynn Pha Pager: [phone] 06/11/2011, 10:44 AM

## 2011-06-11 NOTE — Progress Notes (Signed)
Patient arrived to floor per wheelchair with husband. Patient is alert and oriented; appropriate to person, place and time. Oriented to floor protocol and room accommodations. Explained our safety protocols regarding safety and bed alarms. Patient verbalized understanding.

## 2011-06-11 NOTE — Progress Notes (Signed)
FMTS Attending  Seen by me in NSICU, alert and well appearing. Denies HA. Glucose better controlled. Would check metabolic panel/renal function today.  Paula Compton, M.D.

## 2011-06-11 NOTE — Progress Notes (Signed)
Subjective: Patient reports [name] is doing very well she has very minimal to no denies any numbness doing her arms or legs the  Objective: Vital signs in last 24 hours: Temp:  [97.7 F (36.5 C)-98.5 F (36.9 C)] 97.7 F (36.5 C) (12/22 0400) Pulse Rate:  [57-85] 57  (12/22 0700) Resp:  [10-26] 15  (12/22 0700) BP: (102-134)/(49-75) 117/61 mmHg (12/22 0600) SpO2:  [91 %-97 %] 93 % (12/22 0700)  Intake/Output from previous day: 12/21 0701 - 12/22 0700 In: 2460 [P.O.:480; I.V.:1880; IV Piggyback:100] Out: 450 [Urine:450] Intake/Output this shift:    Strength is 5 out of 5 no evidence of pronator drift she remains the 20 Will by Remove That Layer after Discussion to Dr. Alvester Morin  Lab Results:  [facility] 06/09/11 1502 06/09/11 0212 06/08/11 1934  WBC -- 14.5* 14.5*  HGB 11.2* 12.5 --  HCT 33.0* 37.5 --  PLT -- 430* 448*   BMET  Basename 06/09/11 1506 06/09/11 1502  NA -- 133*  K -- 4.7  CL -- --  CO2 -- --  GLUCOSE 204* --  BUN -- --  CREATININE -- --  CALCIUM -- --    Studies/Results: Ct Head W Wo Contrast  06/10/2011  *RADIOLOGY REPORT*  Clinical Data: Tumor resection today.  Meningioma.  CT HEAD WITHOUT AND WITH CONTRAST  Technique:  Contiguous axial images were obtained from the base of the skull through the vertex without and with intravenous contrast.  Contrast: 75mL OMNIPAQUE IOHEXOL 300 MG/ML IV SOLN  Comparison: MRI 06/03/2011, CT 06/03/2011  Findings: Bifrontal craniotomy for tumor resection.  There is fluid in the frontal sinus.  Postop subdural hematoma left frontal region measuring 8 mm in thickness.  There is some residual calcified tumor present in the left inferior frontal region measuring approximately 15 mm.  There is a mild amount of associated high density in this area which appears to be due to postop hemorrhage.  No enhancing residual tumor is identified.  There is extensive bifrontal edema.  The large cyst in the left frontal lobe has been resected.   There is mass effect on the left frontal horn with 8 mm midline shift left to right.  No definite acute infarct.  Ventricles are not enlarged.  IMPRESSION: Bifrontal craniotomy for meningioma resection.  There appears be some residual calcified tumor in the left inferior frontal lobe with some mild associated postop hemorrhage.  Extensive frontal lobe edema is present with 8 mm midline shift toward the right.  Small left frontal subdural hematoma and mild pneumocephalus are present.  Original Report Authenticated By: Camelia Phenes, M.D.    Assessment/Plan: Progressive mobilization today she's distally 24 hours extubated owl watching her knee enemas today with possibility of maybe transfer her and out later  LOS: 8 days     Thane Age P 06/11/2011, 7:48 AM

## 2011-06-11 NOTE — Plan of Care (Signed)
Problem: Phase I Progression Outcomes Goal: Respiratory status stable Outcome: Progressing Patient continues with increased swelling in the face; potential still exists for airway impairment.  Continued to monitor for difficulties with breathing. Goal: Hemodynamically stable Outcome: Progressing Vitals remain stable.  Blood sugars remain elevated; continues on decadron taper. Goal: Pain controlled with appropriate interventions Outcome: Progressing Use of periodic narcotic medication combined with periods of rest patient reports addressing pain concerns appropriately. Goal: Voiding-avoid urinary catheter unless indicated Outcome: Completed/Met Date Met:  06/11/11 Patient able to ambulate to bathroom and urinating with no c/o of dysuria or oliguria. Goal: Initial discharge plan identified Outcome: Progressing Discussed progression to discharge with pain control and ability to resume life in her home environment.

## 2011-06-12 LAB — CBC
HCT: 29.8 % — ABNORMAL LOW (ref 36.0–46.0)
Hemoglobin: 9.9 g/dL — ABNORMAL LOW (ref 12.0–15.0)
MCH: 27.1 pg (ref 26.0–34.0)
MCHC: 33.2 g/dL (ref 30.0–36.0)
MCV: 81.6 fL (ref 78.0–100.0)
Platelets: 359 10*3/uL (ref 150–400)
RBC: 3.65 MIL/uL — ABNORMAL LOW (ref 3.87–5.11)
RDW: 14.3 % (ref 11.5–15.5)
WBC: 18.6 10*3/uL — ABNORMAL HIGH (ref 4.0–10.5)

## 2011-06-12 LAB — BASIC METABOLIC PANEL
BUN: 14 mg/dL (ref 6–23)
CO2: 25 mEq/L (ref 19–32)
Calcium: 9.1 mg/dL (ref 8.4–10.5)
Chloride: 99 mEq/L (ref 96–112)
Creatinine, Ser: 0.75 mg/dL (ref 0.50–1.10)
GFR calc Af Amer: >90 mL/min (ref >90)
GFR calc non Af Amer: >90 mL/min (ref >90)
Glucose, Bld: 171 mg/dL — ABNORMAL HIGH (ref 70–99)
Potassium: 4.2 mEq/L (ref 3.5–5.1)
Sodium: 132 mEq/L — ABNORMAL LOW (ref 135–145)

## 2011-06-12 LAB — GLUCOSE, CAPILLARY
Glucose-Capillary: 151 mg/dL — ABNORMAL HIGH (ref 70–99)
Glucose-Capillary: 271 mg/dL — ABNORMAL HIGH (ref 70–99)
Glucose-Capillary: 144 mg/dL — ABNORMAL HIGH (ref 70–99)
Glucose-Capillary: 253 mg/dL — ABNORMAL HIGH (ref 70–99)

## 2011-06-12 NOTE — Progress Notes (Signed)
Patient ID: Victoria Mullen, female   DOB: 1950/10/30, 60 y.o.   MRN: [id number] Subjective: Patient reports mild soreness. No visual changes eating ok, mobilizing.  Objective: Vital signs in last 24 hours: Temp:  [97.1 F (36.2 C)-98.4 F (36.9 C)] 98 F (36.7 C) (12/23 0645) Pulse Rate:  [60-115] 62  (12/23 0645) Resp:  [18-20] 20  (12/23 0645) BP: (110-126)/(65-74) 110/70 mmHg (12/23 0645) SpO2:  [94 %-97 %] 97 % (12/23 0645) Weight:  [76.114 kg (167 lb 12.8 oz)] 167 lb 12.8 oz (76.114 kg) (12/22 1130)  Intake/Output from previous day: 12/22 0701 - 12/23 0700 In: 980 [P.O.:740; I.V.:240] Out: -  Intake/Output this shift:    Awake/ alert, incision CDI, MAE x4, conversive, no aphasia  Lab Results: Lab Results  Component Value Date   WBC 18.6* 06/12/2011   HGB 9.9* 06/12/2011   HCT 29.8* 06/12/2011   MCV 81.6 06/12/2011   PLT 359 06/12/2011   Lab Results  Component Value Date   INR 0.91 06/08/2011   BMET Lab Results  Component Value Date   NA 132* 06/12/2011   K 4.2 06/12/2011   CL 99 06/12/2011   CO2 25 06/12/2011   GLUCOSE 171* 06/12/2011   BUN 14 06/12/2011   CREATININE 0.75 06/12/2011   CALCIUM 9.1 06/12/2011    Studies/Results: Ct Head W Wo Contrast  06/10/2011  *RADIOLOGY REPORT*  Clinical Data: Tumor resection today.  Meningioma.  CT HEAD WITHOUT AND WITH CONTRAST  Technique:  Contiguous axial images were obtained from the base of the skull through the vertex without and with intravenous contrast.  Contrast: 75mL OMNIPAQUE IOHEXOL 300 MG/ML IV SOLN  Comparison: MRI 06/03/2011, CT 06/03/2011  Findings: Bifrontal craniotomy for tumor resection.  There is fluid in the frontal sinus.  Postop subdural hematoma left frontal region measuring 8 mm in thickness.  There is some residual calcified tumor present in the left inferior frontal region measuring approximately 15 mm.  There is a mild amount of associated high density in this area which appears to be due to  postop hemorrhage.  No enhancing residual tumor is identified.  There is extensive bifrontal edema.  The large cyst in the left frontal lobe has been resected.  There is mass effect on the left frontal horn with 8 mm midline shift left to right.  No definite acute infarct.  Ventricles are not enlarged.  IMPRESSION: Bifrontal craniotomy for meningioma resection.  There appears be some residual calcified tumor in the left inferior frontal lobe with some mild associated postop hemorrhage.  Extensive frontal lobe edema is present with 8 mm midline shift toward the right.  Small left frontal subdural hematoma and mild pneumocephalus are present.  Original Report Authenticated By: Camelia Phenes, M.D.    Assessment/Plan: Doing great   LOS: 9 days    Victoria Mullen 06/12/2011, 9:29 AM

## 2011-06-12 NOTE — Progress Notes (Signed)
FMTS Daily R3 Progress Note  Subjective: pt is in good spirits, up and walking around ad lib, anticipating d/c on the 24th or 25th.  She is pleased with the care she was received while here.  I have reviewed the patient's medications.  Objective Temp:  [97.1 F (36.2 C)-98.4 F (36.9 C)] 98 F (36.7 C) (12/23 0645) Pulse Rate:  [60-115] 62  (12/23 0645) Resp:  [18-20] 20  (12/23 0645) BP: (110-126)/(65-74) 110/70 mmHg (12/23 0645) SpO2:  [94 %-97 %] 97 % (12/23 0645) Weight:  [167 lb 12.8 oz (76.114 kg)] 167 lb 12.8 oz (76.114 kg) (12/22 1130)   Intake/Output Summary (Last 24 hours) at 06/12/11 0907 Last data filed at 06/11/11 1700  Gross per 24 hour  Intake    580 ml  Output      0 ml  Net    580 ml    CBG (last 3)   Basename 06/12/11 0642 06/11/11 2219 06/11/11 1658  GLUCAP 151* 268* 210*    General: comfortable appearing CV: RRR no murmur Pulm: CTAB Abd: soft and NT Ext: warm no edema   Labs and Imaging  Lab 06/12/11 0530 06/11/11 1227 06/09/11 1502 06/09/11 0212  WBC 18.6* 21.1* -- 14.5*  HGB 9.9* 10.1* 11.2* --  HCT 29.8* 30.6* 33.0* --  PLT 359 342 -- 430*     Lab 06/12/11 0530 06/11/11 1227 06/09/11 1502  NA 132* 130* 133*  K 4.2 4.2 4.7  CL 99 100 --  CO2 25 21 --  BUN 14 12 --  CREATININE 0.75 0.58 --  LABGLOM -- -- --  GLUCOSE 171* -- --  CALCIUM 9.1 8.9 --       Assessment and Plan 1. Brain Tumor: likely meningioma. POD 2. Good recuperation.  Neurosurgery managing. 2. DM  HGBA1C 6.2* 06/03/2011, AM fasting CBG 150. On Steroids.  - SSI  - Lantus . Advance to home dose of lantus while monitoring am fasting CBGs.    3. HTN: Controlled. At Home on Valsartan and HCTZ. No antihypertensive therapy needed during hospitalization due to normal BPs.   -Monitor since Pt on steroids. Consider adding home meds if hypertension.  4. FEN/GI: eating regular diet, off IVF 5. Dispo:  Likely 1-2 more days  Khrystian Schauf Pager:  [phone] 06/12/2011, 9:07 AM

## 2011-06-12 NOTE — Progress Notes (Signed)
FMTS Attending Note  Pt seen by me, appears well.  I agree with Dr. Jeri Lager assessment above.   Paula Compton, M.D.

## 2011-06-13 LAB — CBC
HCT: 30.4 % — ABNORMAL LOW (ref 36.0–46.0)
Hemoglobin: 10 g/dL — ABNORMAL LOW (ref 12.0–15.0)
MCH: 27.2 pg (ref 26.0–34.0)
MCHC: 32.9 g/dL (ref 30.0–36.0)
MCV: 82.8 fL (ref 78.0–100.0)
Platelets: 354 10*3/uL (ref 150–400)
RBC: 3.67 MIL/uL — ABNORMAL LOW (ref 3.87–5.11)
RDW: 14.5 % (ref 11.5–15.5)
WBC: 15.1 10*3/uL — ABNORMAL HIGH (ref 4.0–10.5)

## 2011-06-13 LAB — BASIC METABOLIC PANEL
BUN: 15 mg/dL (ref 6–23)
CO2: 24 mEq/L (ref 19–32)
Calcium: 9.1 mg/dL (ref 8.4–10.5)
Chloride: 99 mEq/L (ref 96–112)
Creatinine, Ser: 0.65 mg/dL (ref 0.50–1.10)
GFR calc Af Amer: >90 mL/min (ref >90)
GFR calc non Af Amer: >90 mL/min (ref >90)
Glucose, Bld: 166 mg/dL — ABNORMAL HIGH (ref 70–99)
Potassium: 3.9 mEq/L (ref 3.5–5.1)
Sodium: 131 mEq/L — ABNORMAL LOW (ref 135–145)

## 2011-06-13 LAB — GLUCOSE, CAPILLARY: Glucose-Capillary: 166 mg/dL — ABNORMAL HIGH (ref 70–99)

## 2011-06-13 MED ORDER — INSULIN GLARGINE 100 UNIT/ML ~~LOC~~ SOLN
26.0000 [IU] | SUBCUTANEOUS | Status: DC
  Administered 2011-06-13: 26 [IU] via SUBCUTANEOUS

## 2011-06-13 MED ORDER — DEXAMETHASONE 4 MG PO TABS: 4.0000 mg | ORAL_TABLET | Freq: Two times a day (BID) | ORAL | Status: AC

## 2011-06-13 MED ORDER — LEVETIRACETAM 500 MG PO TABS: 500.0000 mg | ORAL_TABLET | Freq: Two times a day (BID) | ORAL | Status: DC

## 2011-06-13 MED ORDER — WHITE PETROLATUM GEL
Status: AC
  Administered 2011-06-13: 10:00:00
  Filled 2011-06-13: qty 5

## 2011-06-13 MED ORDER — HYDROCODONE-ACETAMINOPHEN 5-325 MG PO TABS: 1.0000 | ORAL_TABLET | ORAL | Status: AC | PRN

## 2011-06-13 NOTE — Progress Notes (Signed)
  Subjective:    Patient ID: Victoria Mullen, female    DOB: 1951-03-20, 60 y.o.   MRN: [id number]  HPI Patient seen and examined.  My team has discussed with NS.  Patient feels great.  No headache.  She is ready for DC.  She will be DCed today by Dr. Mikel Cella.  Agree with her DC plan.   Review of Systems     Objective:   Physical Exam        Assessment & Plan:

## 2011-06-13 NOTE — Progress Notes (Signed)
Daily Progress Note Si Raider. Clinton Sawyer, M.D., M.B.A  Family Medicine PGY-1 Pager [phone]  Subjective: Patient is post-op day 4 for resection of planum spehenoidale hemangioma   Denies headache, weakness or numbness of extremities, states that she walked around the hallways without problem yesterday; additionally, denies chest pain, SOB; hasn't had bowel movement in several days, but has passed flatus    Objective: Vital signs in last 24 hours: Temp:  [97.8 F (36.6 C)-98.1 F (36.7 C)] 97.9 F (36.6 C) (12/24 0534) Pulse Rate:  [58-84] 58  (12/24 0534) Resp:  [18-20] 20  (12/24 0534) BP: (91-126)/(57-74) 113/73 mmHg (12/24 0534) SpO2:  [94 %-96 %] 94 % (12/24 0534) Weight change:  Last BM Date: 06/07/11  Intake/Output from previous day: 12/23 0701 - 12/24 0700 In: 600 [P.O.:600] Out: -  Intake/Output this shift:    Physical Exam: General: alert, oriented x 4, NAD, pleasant HEENT: frontal incision at hairline extending to pre-auricular area bilaterally, stapled closure, appears wells healing without evidence of infection or dehiscence  Cardiac: RRR, no M,R,G Abdomen: Soft, non-tender, NABS Neuro: CN III-XII grossly intact; upper extremity and lower extremity 5/5 bilat,  Extremity: no evidence of DVT     Lab Results:  [facility] 06/13/11 0601 06/12/11 0530  WBC 15.1* 18.6*  HGB 10.0* 9.9*  HCT 30.4* 29.8*  PLT 354 359   BMET  Basename 06/13/11 0601 06/12/11 0530  NA 131* 132*  K 3.9 4.2  CL 99 99  CO2 24 25  GLUCOSE 166* 171*  BUN 15 14  CREATININE 0.65 0.75  CALCIUM 9.1 9.1    Studies/Results: No results found.  Medications:  I have reviewed the patient's current medications. Scheduled:    . aspirin EC  81 mg Oral Daily  . cholecalciferol  2,000 Units Oral BID  . dexamethasone  4 mg Oral Q12H  . DULoxetine  30 mg Oral Daily  . insulin aspart  0-15 Units Subcutaneous TID WC  . insulin glargine  26 Units Subcutaneous Q0700  . levETIRAcetam  500  mg Oral BID  . topiramate  100 mg Oral BID  . DISCONTD: insulin glargine  26 Units Subcutaneous Q0700  . 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   1. Meningioma - resected 12/20 by Dr. Coletta Memos  - POD day 4, receiving Dexamethasone q 12 hours, last dose scheduled 6:00 AM 12/25  - on Topiramate and Levetiracetam for seizure prophylaxis; will continue until advised to d/c by neurosurgery   2. Insulin - increase Lantus to 26 units q AM starting 12/24  CBG (last 3)   Basename 06/13/11 0612 06/12/11 2124 06/12/11 1633  GLUCAP 166* 253* 144*     3. HTN: holding home valsartan-HCTZ since BP's WNL  4. Hyponatremia: euvolemic, possible SIADH given intracranial trauma, stable and will likely resolve spontaneously, no work-up at this point   5. Psych: continue home duloxetine   6. FENGI: IVF @ KVO, carb modified diet   7. PPX:  8. Dispo: likely d/c 12/24 or 12/25 based upon neurosurgery rec     LOS: 10 days   Mat Carne 06/13/2011, 7:27 AM

## 2011-06-13 NOTE — Progress Notes (Signed)
Patient D/c in stable condition. Instructions reviewed with patient and family

## 2011-06-13 NOTE — Discharge Summary (Signed)
Physician Discharge Summary  Patient ID: Victoria Mullen MRN: [id number] DOB/AGE: 58-Jul-1952 58 y.o. PCP: [facility]    Admit date: 06/03/2011 Discharge date: 06/13/2011  Admission Diagnoses: Headache   Discharge Diagnoses:  Principal Problem:  *Meningioma Active Problems:  Type II or unspecified type diabetes mellitus without mention of complication, not stated as uncontrolled   Discharged Condition: stable  Hospital Course: Victoria Mullen was admitted from [facility] after presenting with an intense headache of one month duration associated with visual changes, but no other neurologic deficits. A CT scan was performed that demonstrated a large tumor of the anterior cranial fossa associated with bilateral frontal edema and a right midline shift. She was placed on Decadron to improve the edema and was evaluated by Dr. Maeola Harman from neurosurgery. He recommended resection for the the planum sphenoidale meningioma. This was successfully performed by his partner Dr. Coletta Memos on 06/09/11. In the post-operative setting there were no complications. She was started on Keppra for seizure prophylaxis. Other health issues addressed during the hospitalization include blood glucose management, which included insulin glargine and insulin aspart. No anti-hypertensive medications were needed. On post-operative day 4, the patient was appropriate for discharge. She will follow-up in the neurosurgery clinic in 10 days for staple removal. Additionally, she will continue Keppra for one month, but she does not require the Topomax.     Follow-Up Issues:  1. Staple Removal 2. Diabetes Control 3. Stopping Keppra on 07/14/11   Consults: Neurosurgery - Dr. Ronaldo Miyamoto L. Cabbell   Significant Diagnostic Studies:   *RADIOLOGY REPORT*  Clinical Data: Tumor resection today. Meningioma.  CT HEAD WITHOUT AND WITH CONTRAST   IMPRESSION:  Bifrontal craniotomy for meningioma  resection. There appears be  some residual calcified tumor in the left inferior frontal lobe  with some mild associated postop hemorrhage. Extensive frontal  lobe edema is present with 8 mm midline shift toward the right.  Small left frontal subdural hematoma and mild pneumocephalus are  present.  Lab Results  Component Value Date   HGBA1C 6.2* 06/03/2011     Discharge Exam: Blood pressure 122/74, pulse 76, temperature 97.9 F (36.6 C), temperature source Oral, resp. rate 20, height 5\' 3"  (1.6 m), weight 167 lb 12.8 oz (76.114 kg), SpO2 95.00%. General: alert, oriented x 4, NAD, pleasant  HEENT: frontal incision at hairline extending to pre-auricular area bilaterally, stapled closure, appears wells healing without evidence of infection or dehiscence  Cardiac: RRR, no M,R,G  Abdomen: Soft, non-tender, NABS  Neuro: CN III-XII grossly intact; upper extremity and lower extremity 5/5 bilat,  Extremity: no evidence of DVT    Disposition: Home or Self Care  Discharge Orders    Future Appointments: Provider: Department: Dept Phone: Center:   08/02/2011 8:45 AM Porfirio Oar, PA [facility] [phone] [facility]     Future Orders Please Complete By Expires   Diet - low sodium heart healthy      Increase activity slowly      Discharge instructions      Comments:   Please stop taking your Topamax.  You will need to get prescriptions filled today for the steroid (continue to take until tomorrow) and for Keppra (seizure preventative). Please make an appointment with neurosurgery in 10 days to have your stitches taken out, and make an appointment with your primary doctor to be seen for a follow-up within the next week. If you have an fevers, changes in vision, weakness, or difficulty  breathing, please call your doctor or return to the Emergency Department.   Call MD for:  temperature >100.4      Call MD for:  persistant nausea and vomiting      Call MD for:  severe uncontrolled pain        Call MD for:  difficulty breathing, headache or visual disturbances        Medication List  As of 06/13/2011 11:07 PM   START taking these medications         dexamethasone 4 MG tablet   Commonly known as: DECADRON   Take 1 tablet (4 mg total) by mouth every 12 (twelve) hours.      HYDROcodone-acetaminophen 5-325 MG per tablet   Commonly known as: NORCO   Take 1-2 tablets by mouth every 4 (four) hours as needed.      levETIRAcetam 500 MG tablet   Commonly known as: KEPPRA   Take 1 tablet (500 mg total) by mouth 2 (two) times daily.         CONTINUE taking these medications         aspirin EC 81 MG tablet      CALCIUM-MAGNESIUM-ZINC PO      DULoxetine 30 MG capsule   Commonly known as: CYMBALTA      ibuprofen 800 MG tablet   Commonly known as: ADVIL,MOTRIN      insulin glargine 100 UNIT/ML injection   Commonly known as: LANTUS      sitaGLIPtan-metformin 50-1000 MG per tablet   Commonly known as: JANUMET      valsartan-hydrochlorothiazide 160-12.5 MG per tablet   Commonly known as: DIOVAN-HCT      Vitamin D3 1000 UNITS Caps         STOP taking these medications         topiramate 100 MG tablet          Where to get your medications    These are the prescriptions that you need to pick up.   You may get these medications from any pharmacy.         dexamethasone 4 MG tablet   HYDROcodone-acetaminophen 5-325 MG per tablet   levETIRAcetam 500 MG tablet             Signed: Mat Carne 06/13/2011, 11:07 PM

## 2011-06-13 NOTE — Progress Notes (Signed)
Patient ID: Victoria Mullen, female   DOB: 03-31-1951, 60 y.o.   MRN: [id number] Stable.no headache.no weakness. Wound dry. Ok to be discharge To be seen in 10 days by dr Franky Macho. Family medicine to dc.

## 2011-08-02 ENCOUNTER — Ambulatory Visit: Payer: Self-pay | Admitting: Physician Assistant

## 2011-08-02 ENCOUNTER — Encounter: Payer: Self-pay | Admitting: *Deleted

## 2011-08-04 ENCOUNTER — Encounter: Payer: Self-pay | Admitting: Family Medicine

## 2011-08-04 ENCOUNTER — Ambulatory Visit (INDEPENDENT_AMBULATORY_CARE_PROVIDER_SITE_OTHER): Payer: BC Managed Care – PPO | Admitting: Family Medicine

## 2011-08-04 DIAGNOSIS — E119 Type 2 diabetes mellitus without complications: Secondary | ICD-10-CM

## 2011-08-04 LAB — LIPID PANEL
Cholesterol: 211 mg/dL — ABNORMAL HIGH (ref 0–200)
HDL: 37 mg/dL — ABNORMAL LOW (ref >39)
LDL Cholesterol: 119 mg/dL — ABNORMAL HIGH (ref 0–99)
Total CHOL/HDL Ratio: 5.7 Ratio
Triglycerides: 277 mg/dL — ABNORMAL HIGH (ref <150)
VLDL: 55 mg/dL — ABNORMAL HIGH (ref 0–40)

## 2011-08-04 LAB — BASIC METABOLIC PANEL
BUN: 10 mg/dL (ref 6–23)
CO2: 24 mEq/L (ref 19–32)
Calcium: 9.4 mg/dL (ref 8.4–10.5)
Chloride: 101 mEq/L (ref 96–112)
Creat: 0.67 mg/dL (ref 0.50–1.10)
Glucose, Bld: 98 mg/dL (ref 70–99)
Potassium: 4 mEq/L (ref 3.5–5.3)
Sodium: 136 mEq/L (ref 135–145)

## 2011-08-04 LAB — POCT GLYCOSYLATED HEMOGLOBIN (HGB A1C): Hemoglobin A1C: 6

## 2011-08-04 MED ORDER — SITAGLIPTIN PHOS-METFORMIN HCL 50-1000 MG PO TABS: 1.0000 | ORAL_TABLET | Freq: Two times a day (BID) | ORAL | Status: DC

## 2011-08-04 NOTE — Progress Notes (Signed)
  Subjective:    Patient ID: Victoria Mullen, female    DOB: [birth year], 61 y.o.   MRN: [id number]  HPI  This 61 y.o Cauc female returns today after craniotomy in December to remove a benign  meningioma (surgery performed by Dr Coletta Memos).She is doing well and has monitored her blood sugars;  the highs have been in the 170-180s and the lows around 140s. She denies symptoms of hypoglycemia  and has been concerned about the effect of steroids which she has been taking; she was also  having problems with Keppra but this med  has been discontinued. Overall, she feels really grateful   for the good outcome she is experiencing.   Review of Systems  Constitutional: Negative.   HENT: Negative.   Respiratory: Negative.   Neurological: Negative for dizziness, syncope, weakness and headaches.  Psychiatric/Behavioral: Negative.        Objective:   Physical Exam  Nursing note and vitals reviewed. Constitutional: She is oriented to person, place, and time. She appears well-developed and well-nourished.  HENT:  Head: Normocephalic.       Healing frontal craniotomy scar.  Eyes: Conjunctivae and EOM are normal. No scleral icterus.  Neck: Normal range of motion.  Cardiovascular: Normal rate.   Pulmonary/Chest: Effort normal. No respiratory distress.  Neurological: She is alert and oriented to person, place, and time. Coordination normal.  Psychiatric: She has a normal mood and affect. Her behavior is normal. Judgment normal.   Results for orders placed in visit on 08/04/11  POCT GLYCOSYLATED HEMOGLOBIN (HGB A1C)      Component Value Range   Hemoglobin A1C 6.0            Assessment & Plan:   1. Diabetes mellitus  Lipid panel, Basic metabolic panel. A1C= 6.0%. Continue Janumet and Insulin at current doses. RF for Janumet x 6 months. This RX will be faxed to [facility]. RTC in 3 months or sooner if needed. Follow-up with Neurosurgeon as scheduled.  Crestor still being held pending  Lipid results.

## 2011-08-04 NOTE — Patient Instructions (Signed)
Your current medications are controlling your BP and blood sugar. You will be notified about your lipid results.

## 2011-08-05 ENCOUNTER — Other Ambulatory Visit: Payer: Self-pay | Admitting: Family Medicine

## 2011-08-05 ENCOUNTER — Telehealth: Payer: Self-pay

## 2011-08-05 DIAGNOSIS — E785 Hyperlipidemia, unspecified: Secondary | ICD-10-CM

## 2011-08-05 NOTE — Telephone Encounter (Signed)
Spoke with pt concerning labs and instrs for recheck in 8 wks. Transferred pt to appt center to set up appt per pt request.

## 2011-08-05 NOTE — Progress Notes (Signed)
Quick Note:  You labs are abnormal. Please call pt and advise her that this is expected result given that she has been off Crestor. I would like for her to really work on improving her diet and maintaining  Diabetes control. Let's re-check lipids in 8 weeks- I will sch fasting labs for her.  She would like a copy of results.   Thank you.  ______

## 2011-08-10 ENCOUNTER — Other Ambulatory Visit: Payer: Self-pay | Admitting: Family Medicine

## 2011-08-24 ENCOUNTER — Telehealth: Payer: Self-pay

## 2011-08-24 NOTE — Telephone Encounter (Signed)
PATIENT OF DR. MCPHERSON & CHELLE - SHE HAS BEEN ON SYMBALTA, BUT HAS BEEN WITHOUT IT FOR FOUR DAYS AND IS FEELING "SWIMMY HEADED".  HOWEVER, SHE ALSO JUST HAD BRAIN SURGERY.  SHE WANTS TO  KNOW IF THIS FEELING IS DUE TO HER BEING OFF THIS MEDICATION.  THE REASON SHE HAS NOT BEEN TAKING IT IS BECAUSE THE PRESCRIPTION MAIL SERVICE SHE HAS BEEN USING IS DELAYED.  PLEASE CALL ASAP

## 2011-08-24 NOTE — Telephone Encounter (Signed)
lmom for pt to cb.   mb

## 2011-08-25 NOTE — Telephone Encounter (Signed)
Pt reports she has had no bad SEs after her brain surgery, and her migraines have completely resolved. She has been w/out her cymbalta for about a wk now and is "swimmy headed", although the feeling is getting better - not as bad today. I told her that this was probably a SE of stopping the Cymbalta abruptly instead of tapering off. She states she was only taking it for pain and wonders if she can just stay off of it now that pain has resolved after surgery? Would it help to restart and then taper off, or since she has been off of it for a wk and Sxs improving, is it better to just stay off? She'd rather not stay on a medication she does not need.

## 2011-08-26 NOTE — Telephone Encounter (Signed)
Advise pt that since her withdrawal symptoms seem to be resolving and she would rather not be on the medication, it is reasonable to remain off the medication. Contact clinic if symptoms persist or worsen.

## 2011-08-27 NOTE — Telephone Encounter (Signed)
Pt husband would like for nurse or Dr to call him back about his wife medication he has some concerns about if she should stop taking this he states noone has returned their call since Wednesday.Victoria Mullen

## 2011-08-29 NOTE — Telephone Encounter (Signed)
Gave pt info from Dr Audria Nine. Pt agreed

## 2011-09-28 ENCOUNTER — Ambulatory Visit (INDEPENDENT_AMBULATORY_CARE_PROVIDER_SITE_OTHER): Payer: BC Managed Care – PPO | Admitting: Family Medicine

## 2011-09-28 ENCOUNTER — Encounter: Payer: Self-pay | Admitting: Family Medicine

## 2011-09-28 VITALS — BP 136/82 | HR 106 | Temp 98.3°F | Resp 16 | Ht 63.0 in | Wt 153.8 lb

## 2011-09-28 DIAGNOSIS — E119 Type 2 diabetes mellitus without complications: Secondary | ICD-10-CM

## 2011-09-28 DIAGNOSIS — R609 Edema, unspecified: Secondary | ICD-10-CM

## 2011-09-28 DIAGNOSIS — I10 Essential (primary) hypertension: Secondary | ICD-10-CM

## 2011-09-28 DIAGNOSIS — Z130 Encounter for screening for diseases of the blood and blood-forming organs and certain disorders involving the immune mechanism: Secondary | ICD-10-CM

## 2011-09-28 DIAGNOSIS — Z13220 Encounter for screening for lipoid disorders: Secondary | ICD-10-CM

## 2011-09-28 DIAGNOSIS — R600 Localized edema: Secondary | ICD-10-CM

## 2011-09-28 LAB — COMPREHENSIVE METABOLIC PANEL
ALT: 15 U/L (ref 0–35)
AST: 16 U/L (ref 0–37)
Albumin: 4.2 g/dL (ref 3.5–5.2)
Alkaline Phosphatase: 57 U/L (ref 39–117)
BUN: 14 mg/dL (ref 6–23)
CO2: 25 mEq/L (ref 19–32)
Calcium: 9.8 mg/dL (ref 8.4–10.5)
Chloride: 102 mEq/L (ref 96–112)
Creat: 0.7 mg/dL (ref 0.50–1.10)
Glucose, Bld: 77 mg/dL (ref 70–99)
Potassium: 4 mEq/L (ref 3.5–5.3)
Sodium: 137 mEq/L (ref 135–145)
Total Bilirubin: 0.5 mg/dL (ref 0.3–1.2)
Total Protein: 7.1 g/dL (ref 6.0–8.3)

## 2011-09-28 LAB — LIPID PANEL
Cholesterol: 203 mg/dL — ABNORMAL HIGH (ref 0–200)
HDL: 44 mg/dL (ref >39)
LDL Cholesterol: 125 mg/dL — ABNORMAL HIGH (ref 0–99)
Total CHOL/HDL Ratio: 4.6 Ratio
Triglycerides: 171 mg/dL — ABNORMAL HIGH (ref <150)
VLDL: 34 mg/dL (ref 0–40)

## 2011-09-28 LAB — TSH: TSH: 1.994 u[IU]/mL (ref 0.350–4.500)

## 2011-09-28 NOTE — Patient Instructions (Signed)
Edema Edema is an abnormal build-up of fluids in tissues. Because this is partly dependent on gravity (water flows to the lowest place), it is more common in the leg sand thighs (lower extremities). It is also common in the looser tissues, like around the eyes. Painless swelling of the feet and ankles is common and increases as a person ages. It may affect both legs and may include the calves or even thighs. When squeezed, the fluid may move out of the affected area and may leave a dent for a few moments. CAUSES   Prolonged standing or sitting in one place for extended periods of time. Movement helps pump tissue fluid into the veins, and absence of movement prevents this, resulting in edema.   Varicose veins. The valves in the veins do not work as well as they should. This causes fluid to leak into the tissues.   Fluid and salt overload.   Injury, burn, or surgery to the leg, ankle, or foot, may damage veins and allow fluid to leak out.   Sunburn damages vessels. Leaky vessels allow fluid to go out into the sunburned tissues.   Allergies (from insect bites or stings, medications or chemicals) cause swelling by allowing vessels to become leaky.   Protein in the blood helps keep fluid in your vessels. Low protein, as in malnutrition, allows fluid to leak out.   Hormonal changes, including pregnancy and menstruation, cause fluid retention. This fluid may leak out of vessels and cause edema.   Medications that cause fluid retention. Examples are sex hormones, blood pressure medications, steroid treatment, or anti-depressants.   Some illnesses cause edema, especially heart failure, kidney disease, or liver disease.   Surgery that cuts veins or lymph nodes, such as surgery done for the heart or for breast cancer, may result in edema.  DIAGNOSIS  Your caregiver is usually easily able to determine what is causing your swelling (edema) by simply asking what is wrong (getting a history) and examining  you (doing a physical). Sometimes x-rays, EKG (electrocardiogram or heart tracing), and blood work may be done to evaluate for underlying medical illness. TREATMENT  General treatment includes:  Leg elevation (or elevation of the affected body part).   Restriction of fluid intake.   Prevention of fluid overload.   Compression of the affected body part. Compression with elastic bandages or support stockings squeezes the tissues, preventing fluid from entering and forcing it back into the blood vessels.   Diuretics (also called water pills or fluid pills) pull fluid out of your body in the form of increased urination. These are effective in reducing the swelling, but can have side effects and must be used only under your caregiver's supervision. Diuretics are appropriate only for some types of edema.  The specific treatment can be directed at any underlying causes discovered. Heart, liver, or kidney disease should be treated appropriately. HOME CARE INSTRUCTIONS   Elevate the legs (or affected body part) above the level of the heart, while lying down.   Avoid sitting or standing still for prolonged periods of time.   Avoid putting anything directly under the knees when lying down, and do not wear constricting clothing or garters on the upper legs.   Exercising the legs causes the fluid to work back into the veins and lymphatic channels. This may help the swelling go down.   The pressure applied by elastic bandages or support stockings can help reduce ankle swelling.   A low-salt diet may help reduce fluid   retention and decrease the ankle swelling.   Take any medications exactly as prescribed.  SEEK MEDICAL CARE IF:  Your edema is not responding to recommended treatments. SEEK IMMEDIATE MEDICAL CARE IF:   You develop shortness of breath or chest pain.   You cannot breathe when you lay down; or if, while lying down, you have to get up and go to the window to get your breath.   You  are having increasing swelling without relief from treatment.   You develop a fever over 102 F (38.9 C).   You develop pain or redness in the areas that are swollen.   Tell your caregiver right away if you have gained 3 lb/1.4 kg in 1 day or 5 lb/2.3 kg in a week.  MAKE SURE YOU:   Understand these instructions.   Will watch your condition.   Will get help right away if you are not doing well or get worse.  Document Released: 06/06/2005 Document Revised: 05/26/2011 Document Reviewed: 01/23/2008 Exit[employer] Patient Information 2012 [employer]. 

## 2011-09-28 NOTE — Progress Notes (Signed)
  Subjective:    Patient ID: Victoria Mullen, female    DOB: [birth year], 61 y.o.   MRN: [id number]  HPI  This pt is 4 months post-craniotomy for meningioma and is doing well. She is staying active with  a community theater and is walking almost daily. She has bilateral ankle edema x 3 weeks; she denies  palpitations, chest pain or SOB. Her ankles and legs are not painful.      Re: Type II DM- FSBS are in the normal range without hypoglycemia or any symptoms suggestive of   hyperglycemia. She has continued to adjust her diet and has lost some weight.      Review of Systems  Constitutional: Negative.   Respiratory: Negative.   Cardiovascular: Positive for leg swelling. Negative for chest pain and palpitations.  Gastrointestinal: Negative for abdominal distention.  Genitourinary: Negative.   Skin: Positive for color change and pallor.  Neurological: Negative.        Objective:   Physical Exam  Nursing note and vitals reviewed. Constitutional: She is oriented to person, place, and time. She appears well-developed and well-nourished. No distress.  HENT:  Head: Normocephalic and atraumatic.  Eyes: Conjunctivae and EOM are normal. No scleral icterus.  Neck: Normal range of motion. Neck supple. No thyromegaly present.  Cardiovascular: Normal rate, regular rhythm and normal heart sounds.   Pulmonary/Chest: Effort normal and breath sounds normal. No respiratory distress.  Musculoskeletal: She exhibits edema.       Trace ankle and pretibial edema; no calf tenderness  Neurological: She is alert and oriented to person, place, and time. She displays abnormal reflex.       Hyporeflexia is noted- upper and lower ext  Skin: Skin is warm and dry. No erythema. No pallor.  Psychiatric: She has a normal mood and affect. Her behavior is normal. Thought content normal.          Assessment & Plan:   1. Screening for other and unspecified deficiency anemia  CBC  2. Type II diabetes mellitus   Comprehensive metabolic panel; Last A1c= 6.0% Re-check in 4 months   3. Screening for hyperlipidemia  Lipid panel  4. Edema of both legs  TSH; pt was on Thyroid replacement in the past but could not tolerate the medication  5. HTN (hypertension)  Comprehensive metabolic panel; RF: Valsartan- HCTZ through [facility]

## 2011-09-29 LAB — CBC
HCT: 39.2 % (ref 36.0–46.0)
Hemoglobin: 12.5 g/dL (ref 12.0–15.0)
MCH: 25.7 pg — ABNORMAL LOW (ref 26.0–34.0)
MCHC: 31.9 g/dL (ref 30.0–36.0)
MCV: 80.7 fL (ref 78.0–100.0)
Platelets: 420 10*3/uL — ABNORMAL HIGH (ref 150–400)
RBC: 4.86 MIL/uL (ref 3.87–5.11)
RDW: 14.5 % (ref 11.5–15.5)
WBC: 9.8 10*3/uL (ref 4.0–10.5)

## 2011-10-04 NOTE — Progress Notes (Signed)
Quick Note:  Please call pt and advise that the following labs are abnormal...  Lipids are elevated with LDL ("bad") cholesterol = 409. This is higher than our target value for Diabetics. Stay on your current medications  and try to improve your diet by eating "Whole foods"- more fruits and vegetables with fiber-rich foods and grains. Eliminate any unhealthy foods  like fried foods, processed foods and unnecessary fats and starches in diet. Triglycerides are above 150 and this number needs to be lower. Continue to stay active as this will help lower your lipids. Kidney and liver tests and thyroid tests are normal.  See you in 4 months.  Provide copy of labs to pt. ______

## 2011-11-02 ENCOUNTER — Ambulatory Visit: Payer: BC Managed Care – PPO | Admitting: Family Medicine

## 2011-12-04 ENCOUNTER — Ambulatory Visit (INDEPENDENT_AMBULATORY_CARE_PROVIDER_SITE_OTHER): Payer: BC Managed Care – PPO | Admitting: Physician Assistant

## 2011-12-04 VITALS — BP 105/62 | HR 89 | Temp 97.9°F | Resp 16 | Ht 62.5 in | Wt 152.0 lb

## 2011-12-04 DIAGNOSIS — R519 Headache, unspecified: Secondary | ICD-10-CM

## 2011-12-04 DIAGNOSIS — R51 Headache: Secondary | ICD-10-CM

## 2011-12-04 DIAGNOSIS — R0982 Postnasal drip: Secondary | ICD-10-CM

## 2011-12-04 DIAGNOSIS — R059 Cough, unspecified: Secondary | ICD-10-CM

## 2011-12-04 DIAGNOSIS — E119 Type 2 diabetes mellitus without complications: Secondary | ICD-10-CM

## 2011-12-04 DIAGNOSIS — I10 Essential (primary) hypertension: Secondary | ICD-10-CM

## 2011-12-04 DIAGNOSIS — R05 Cough: Secondary | ICD-10-CM

## 2011-12-04 DIAGNOSIS — J329 Chronic sinusitis, unspecified: Secondary | ICD-10-CM

## 2011-12-04 MED ORDER — IBUPROFEN 800 MG PO TABS: 800.0000 mg | ORAL_TABLET | ORAL | Status: DC | PRN

## 2011-12-04 MED ORDER — VALSARTAN 160 MG PO TABS: 160.0000 mg | ORAL_TABLET | Freq: Every day | ORAL | Status: DC

## 2011-12-04 MED ORDER — GUAIFENESIN ER 1200 MG PO TB12: 1.0000 | ORAL_TABLET | Freq: Two times a day (BID) | ORAL | Status: DC | PRN

## 2011-12-04 MED ORDER — CEFDINIR 300 MG PO CAPS: 600.0000 mg | ORAL_CAPSULE | Freq: Every day | ORAL | Status: AC

## 2011-12-04 MED ORDER — IPRATROPIUM BROMIDE 0.03 % NA SOLN: 2.0000 | Freq: Two times a day (BID) | NASAL | Status: DC

## 2011-12-04 MED ORDER — HYDROCOD POLST-CHLORPHEN POLST 10-8 MG/5ML PO LQCR: 5.0000 mL | Freq: Two times a day (BID) | ORAL | Status: DC | PRN

## 2011-12-04 NOTE — Progress Notes (Signed)
  Subjective:    Patient ID: Victoria Mullen, female    DOB: 1950/08/03, 61 y.o.   MRN: [id number]  HPI  Presents with 2 months of cough, worse x 2 weeks.  Describes the collection of phlegm on the right side of the throat, that she can't swallow.  She coughs in jags until she is able to cough up the phlegm, or gets something to eat or drink to "push it down."  Symptoms are not worse while eating, but can be worse after she eats.  Also bothersome at night, but doesn't seem worse with lying down.  No nasal congestion, ear fullness, sore throat.  No SOB, CP.  No heartburn, indigestion, nausea. She notes she's had this before, associated with a sinusitis in the right maxillary sinus, though at that time she also had significant right facial pain and pressure. She awoke yesterday with her hand on her right cheek, and wondered if she subconsciously has discomfort there.  Last week, she started Lisinopril 20 mg BID in place of DiovanHCT.  Her cardiologist changed it due to photosensitivity she was experiencing on the DiovanHCT.  Since the change, she complains of feeling tired earlier in the evening (that had improved after the meningioma resection).  Additionally, she doesn't want to take lisinopril.  A friend of hers died of renal failure after starting lisinopril, and even though she understands that the large majority of people take ACEI drugs without poor outcomes, she doesn't want to take that risk.  Her diabetes has been well controlled, fasting readings 100-115, afternoons 80's, post-prandial 100-115.  She underwent resection of a large meningioma in December.  Reports she feels like a different person, she feels so much better. She still gets HA, but they are milder and infrequent.  Review of Systems As above.    Objective:   Physical Exam  Vital signs noted. Well-developed, well nourished WF who is awake, alert and oriented, in NAD. HEENT: [place]/AT, PERRL, EOMI.  Sclera and conjunctiva are  clear.  EAC are patent, TMs are normal in appearance. Nasal mucosa is pink and moist. OP is clear. The frontal sinus is tender (this is also the site of her surgery, however) and both maxillary sinuses are tender, R>L. Neck: supple, non-tender, no lymphadenopathy, thyromegaly. Heart: RRR, no murmur Lungs: CTA     Assessment & Plan:   1. Cough  Guaifenesin (MUCINEX MAXIMUM STRENGTH) 1200 MG TB12, cefdinir (OMNICEF) 300 MG capsule, chlorpheniramine-HYDROcodone (TUSSIONEX PENNKINETIC ER) 10-8 MG/5ML LQCR  2. Post-nasal drainage  ipratropium (ATROVENT) 0.03 % nasal spray  3. HTN (hypertension)  valsartan (DIOVAN) 160 MG tablet, valsartan (DIOVAN) 160 MG tablet  4. HA (headache)  ibuprofen (ADVIL,MOTRIN) 800 MG tablet   Since her cough began several months ago, I doubt the ACEI is the cause, but it certainly could have contributed to the worsening.  She D/C of the lisinopril is more due to her discomfort taking it than that I think it's related to her cough.  Also consider allergies and GERD as cause of her symptoms.  If she doesn't find resolution with the treatment above, re-evaluate.  Consider sinus imaging. Avoid prednisone due to her DM.

## 2011-12-04 NOTE — Patient Instructions (Addendum)
Get plenty of rest and drink at least 64 ounces of water daily. You should begin to improve in the next 48-72 hours.  If you are not well in 10 days, return for additional evaluation.  Stop the Lisinopril, since we've switched to valsartan (Diovan) without the HCTZ component that caused the sun sensitivity.

## 2011-12-12 ENCOUNTER — Telehealth: Payer: Self-pay

## 2011-12-12 DIAGNOSIS — R05 Cough: Secondary | ICD-10-CM

## 2011-12-12 DIAGNOSIS — R053 Chronic cough: Secondary | ICD-10-CM

## 2011-12-12 DIAGNOSIS — R0982 Postnasal drip: Secondary | ICD-10-CM

## 2011-12-12 NOTE — Telephone Encounter (Signed)
Pt saw Chelle Sunday a week ago. Symptoms have not changed. Should something else be called in? [facility] on [address]. [place].

## 2011-12-13 NOTE — Telephone Encounter (Signed)
Spoke w/pt and she would rather be referred to an ENT. She does not have a preference, but would like a call w/appt instead of mailing notice of appt. Started referral to ENT

## 2011-12-13 NOTE — Telephone Encounter (Signed)
Do you want to Rx a different Abx, or order sinus imaging and f/up (see Chelle's note)?

## 2011-12-13 NOTE — Telephone Encounter (Signed)
It looks like we should either do sinus CT or maybe refer to ENT. Does the patient have a preference?

## 2011-12-14 ENCOUNTER — Telehealth: Payer: Self-pay

## 2011-12-14 MED ORDER — INSULIN GLARGINE 100 UNIT/ML ~~LOC~~ SOLN: 30.0000 [IU] | Freq: Every day | SUBCUTANEOUS | Status: DC

## 2011-12-14 NOTE — Telephone Encounter (Signed)
Ok through August-- will refill 3 mth supply of Lantus Solostar--pt notified.

## 2011-12-14 NOTE — Telephone Encounter (Signed)
Patient of Dr. Audria Nine. Needs refill on lantus insulin thru [facility] and needs it really soon.  Best call back # [phone]

## 2011-12-15 ENCOUNTER — Other Ambulatory Visit: Payer: Self-pay | Admitting: Physician Assistant

## 2012-02-01 ENCOUNTER — Ambulatory Visit: Payer: BC Managed Care – PPO | Admitting: Family Medicine

## 2012-02-10 LAB — HM DIABETES EYE EXAM

## 2012-02-15 ENCOUNTER — Encounter: Payer: Self-pay | Admitting: Physician Assistant

## 2012-02-23 NOTE — Progress Notes (Signed)
This encounter was created in error - please disregard.

## 2012-03-05 ENCOUNTER — Ambulatory Visit (HOSPITAL_COMMUNITY)
Admission: RE | Admit: 2012-03-05 | Discharge: 2012-03-05 | Disposition: A | Discharge status: Home / Self Care | Payer: BC Managed Care – PPO | Source: Ambulatory Visit | Attending: Otolaryngology | Admitting: Otolaryngology

## 2012-03-05 ENCOUNTER — Encounter (HOSPITAL_COMMUNITY)
Admission: RE | Disposition: A | Discharge status: Home / Self Care | Payer: Self-pay | Source: Ambulatory Visit | Attending: Otolaryngology

## 2012-03-05 DIAGNOSIS — R059 Cough, unspecified: Secondary | ICD-10-CM | POA: Insufficient documentation

## 2012-03-05 DIAGNOSIS — R05 Cough: Secondary | ICD-10-CM | POA: Insufficient documentation

## 2012-03-05 HISTORY — PX: ESOPHAGEAL MANOMETRY: SHX5429

## 2012-03-05 HISTORY — PX: 24 HOUR PH STUDY: SHX5419

## 2012-03-05 SURGERY — MONITORING, ESOPHAGEAL PH, 24 HOUR

## 2012-03-05 MED ORDER — LIDOCAINE VISCOUS 2 % MT SOLN
OROMUCOSAL | Status: AC
  Filled 2012-03-05: qty 15

## 2012-03-06 ENCOUNTER — Encounter (HOSPITAL_COMMUNITY): Payer: Self-pay | Admitting: Otolaryngology

## 2012-03-28 ENCOUNTER — Ambulatory Visit (INDEPENDENT_AMBULATORY_CARE_PROVIDER_SITE_OTHER): Payer: BC Managed Care – PPO | Admitting: Family Medicine

## 2012-03-28 VITALS — BP 109/68 | HR 73 | Temp 97.7°F | Resp 16 | Ht 62.5 in | Wt 155.0 lb

## 2012-03-28 DIAGNOSIS — Z23 Encounter for immunization: Secondary | ICD-10-CM

## 2012-03-28 DIAGNOSIS — R059 Cough, unspecified: Secondary | ICD-10-CM

## 2012-03-28 DIAGNOSIS — E119 Type 2 diabetes mellitus without complications: Secondary | ICD-10-CM

## 2012-03-28 DIAGNOSIS — I10 Essential (primary) hypertension: Secondary | ICD-10-CM

## 2012-03-28 DIAGNOSIS — N62 Hypertrophy of breast: Secondary | ICD-10-CM

## 2012-03-28 DIAGNOSIS — R05 Cough: Secondary | ICD-10-CM

## 2012-03-28 DIAGNOSIS — E785 Hyperlipidemia, unspecified: Secondary | ICD-10-CM

## 2012-03-28 LAB — BASIC METABOLIC PANEL
BUN: 17 mg/dL (ref 6–23)
CO2: 28 mEq/L (ref 19–32)
Calcium: 9.8 mg/dL (ref 8.4–10.5)
Chloride: 100 mEq/L (ref 96–112)
Creat: 0.68 mg/dL (ref 0.50–1.10)
Glucose, Bld: 97 mg/dL (ref 70–99)
Potassium: 4.6 mEq/L (ref 3.5–5.3)
Sodium: 138 mEq/L (ref 135–145)

## 2012-03-28 LAB — POCT GLYCOSYLATED HEMOGLOBIN (HGB A1C): Hemoglobin A1C: 5.8

## 2012-03-28 LAB — LIPID PANEL
Cholesterol: 228 mg/dL — ABNORMAL HIGH (ref 0–200)
HDL: 38 mg/dL — ABNORMAL LOW (ref >39)
LDL Cholesterol: 127 mg/dL — ABNORMAL HIGH (ref 0–99)
Total CHOL/HDL Ratio: 6 Ratio
Triglycerides: 316 mg/dL — ABNORMAL HIGH (ref <150)
VLDL: 63 mg/dL — ABNORMAL HIGH (ref 0–40)

## 2012-03-28 LAB — ALT: ALT: 16 U/L (ref 0–35)

## 2012-03-28 MED ORDER — VALSARTAN 80 MG PO TABS: 80.0000 mg | ORAL_TABLET | Freq: Every day | ORAL | Status: DC

## 2012-03-28 MED ORDER — SITAGLIPTIN PHOS-METFORMIN HCL 50-1000 MG PO TABS: 1.0000 | ORAL_TABLET | Freq: Two times a day (BID) | ORAL | Status: DC

## 2012-03-28 MED ORDER — INSULIN GLARGINE 100 UNIT/ML ~~LOC~~ SOLN: 30.0000 [IU] | Freq: Every day | SUBCUTANEOUS | Status: DC

## 2012-03-28 NOTE — Patient Instructions (Signed)
Blood pressure medication dose has been reduced by half to see if this will affect your cough. A referral has been placed for you to see a Plastic Surgeon- Dr. Lacretia Nicks. Delia Chimes, II is who I have requested for you to see.

## 2012-03-29 ENCOUNTER — Encounter: Payer: Self-pay | Admitting: Family Medicine

## 2012-03-29 NOTE — Progress Notes (Signed)
  Subjective:    Patient ID: Victoria Mullen, female    DOB: 09-Apr-1951, 61 y.o.   MRN: [id number]  HPI   This 61 y.o. Cauc female is here for DM and HTN follow-up in addition to fasting lab work.  Her other concerns re: chronic cough which has been evaluated by specialist; tentative diagnosis  is GERD but she denies any symptoms c/w that diagnosis. She is awaiting esophageal manometry  results. She does not think symptoms are allergy related either.   She also desires evaluation of large breasts and whether she can be referred for surgery (breast  reduction). Large breasts are causing dyspnea and breathing restrictions. Wearing a well-fitting bra  only increased feeling of constriction around ribcage.    Review of Systems  Constitutional: Negative for fever, diaphoresis, activity change, appetite change and fatigue.  Respiratory: Positive for shortness of breath. Negative for cough, chest tightness and wheezing.   Cardiovascular: Negative for chest pain, palpitations and leg swelling.  Gastrointestinal: Negative.   Musculoskeletal: Negative for myalgias and back pain.  Skin: Negative.   Neurological: Negative for dizziness, syncope, weakness, numbness and headaches.  Psychiatric/Behavioral: Negative.        Objective:   Physical Exam  Nursing note and vitals reviewed. Constitutional: She is oriented to person, place, and time. She appears well-developed and well-nourished. No distress.  HENT:  Head: Normocephalic and atraumatic.  Right Ear: External ear normal.  Left Ear: External ear normal.  Nose: Nose normal.  Mouth/Throat: Oropharynx is clear and moist. No oropharyngeal exudate.  Eyes: Conjunctivae normal and EOM are normal. No scleral icterus.  Neck: Normal range of motion. Neck supple. No thyromegaly present.  Cardiovascular: Normal rate, regular rhythm and normal heart sounds.  Exam reveals no gallop and no friction rub.   No murmur heard. Pulmonary/Chest: Effort normal  and breath sounds normal. No respiratory distress. She has no wheezes. She exhibits no tenderness. Right breast exhibits no inverted nipple, no mass, no nipple discharge and no skin change. Left breast exhibits no inverted nipple, no mass, no nipple discharge and no skin change.       Breasts are heavy and pendulous (hanging down to waist)  Musculoskeletal: Normal range of motion. She exhibits no edema and no tenderness.  Lymphadenopathy:    She has no cervical adenopathy.  Neurological: She is alert and oriented to person, place, and time. No cranial nerve deficit.  Skin: Skin is warm and dry.  Psychiatric: She has a normal mood and affect. Her behavior is normal. Judgment and thought content normal.    A1C= 5.8%      Assessment & Plan:   1. Type II or unspecified type diabetes mellitus without mention of complication, not stated as uncontrolled - well controlled  Basic metabolic panel  2. HTN (hypertension)  valsartan (DIOVAN) 80 MG tablet  Basic metabolic panel  3. Large breasts with physical discomfort Ambulatory referral to Plastic Surgery  4. Cough - suspect ARB side effect; need to maintain medication for reno-protective effect Reduce Valsartan to 80 mg daily dose to see if this reduces cough  5. Hyperlipidemia LDL goal < 100  Lipid panel, ALT  6. Need for influenza vaccination  Flu vaccine greater than or equal to 3yo preservative free IM

## 2012-03-30 NOTE — Progress Notes (Signed)
Quick Note:  Please call pt and advise that the following labs are abnormal... All lipid values are abnormal. Cardiovascular risk based on these numbers in almost 2x average. If still taking Crestor, need to increase the dose. If not taking Crestor (rosuvastatin), need to resume it or try a different medication.  Other labs (kidney tests, sodium/ potassium/ calcium) all normal.   Copy to pt. ______

## 2012-04-13 ENCOUNTER — Telehealth: Payer: Self-pay

## 2012-04-13 DIAGNOSIS — N62 Hypertrophy of breast: Secondary | ICD-10-CM

## 2012-04-13 NOTE — Telephone Encounter (Signed)
Patient found a Engineer, petroleum at [facility] for breast reduction surgery she had discussed with Dr Audria Nine. She now needs referral from Dr Audria Nine for [facility] Appointment is October 30th.  Best # [phone]

## 2012-04-14 NOTE — Telephone Encounter (Signed)
Please advise 

## 2012-04-16 NOTE — Telephone Encounter (Signed)
Consider this note verbal authorization for referral to [facility] as per pt's request.

## 2012-04-17 NOTE — Telephone Encounter (Signed)
Called Lupita Leash in Referrals bc it looks like this order has already been put in. Lupita Leash stated that she [facility] for pt yesterday to [facility] w/contact info for [facility] bc they have been unable to locate the facility to send over referral info and notes.   Called pt and explained that we need contact info and she stated she will find the info and call the Referral Dept back.

## 2012-04-20 ENCOUNTER — Ambulatory Visit (INDEPENDENT_AMBULATORY_CARE_PROVIDER_SITE_OTHER): Payer: BC Managed Care – PPO | Admitting: Family Medicine

## 2012-04-20 ENCOUNTER — Encounter: Payer: Self-pay | Admitting: Family Medicine

## 2012-04-20 VITALS — BP 112/64 | HR 84 | Temp 97.0°F | Resp 16 | Ht 63.0 in | Wt 158.0 lb

## 2012-04-20 DIAGNOSIS — Z01818 Encounter for other preprocedural examination: Secondary | ICD-10-CM

## 2012-04-20 DIAGNOSIS — I10 Essential (primary) hypertension: Secondary | ICD-10-CM

## 2012-04-20 NOTE — Patient Instructions (Signed)
I will draft a letter to your plastic surgeon and include your lab results and other pertinent data. This should be completed within the next 7-10 days. Your ECG looks normal.

## 2012-04-21 LAB — CBC WITH DIFFERENTIAL/PLATELET
Basophils Absolute: 0.1 10*3/uL (ref 0.0–0.1)
Basophils Relative: 1 % (ref 0–1)
Eosinophils Absolute: 0.4 10*3/uL (ref 0.0–0.7)
Eosinophils Relative: 4 % (ref 0–5)
HCT: 38.5 % (ref 36.0–46.0)
Hemoglobin: 13.4 g/dL (ref 12.0–15.0)
Lymphocytes Relative: 29 % (ref 12–46)
Lymphs Abs: 3.2 10*3/uL (ref 0.7–4.0)
MCH: 27.8 pg (ref 26.0–34.0)
MCHC: 34.8 g/dL (ref 30.0–36.0)
MCV: 79.9 fL (ref 78.0–100.0)
Monocytes Absolute: 1 10*3/uL (ref 0.1–1.0)
Monocytes Relative: 9 % (ref 3–12)
Neutro Abs: 6.4 10*3/uL (ref 1.7–7.7)
Neutrophils Relative %: 57 % (ref 43–77)
Platelets: 408 10*3/uL — ABNORMAL HIGH (ref 150–400)
RBC: 4.82 MIL/uL (ref 3.87–5.11)
RDW: 13.5 % (ref 11.5–15.5)
WBC: 11.1 10*3/uL — ABNORMAL HIGH (ref 4.0–10.5)

## 2012-04-22 NOTE — Progress Notes (Signed)
Quick Note:  Please notify pt that results are normal.   Provide pt with copy of labs. ______ 

## 2012-04-23 ENCOUNTER — Encounter: Payer: Self-pay | Admitting: *Deleted

## 2012-04-25 ENCOUNTER — Encounter: Payer: Self-pay | Admitting: Family Medicine

## 2012-04-25 NOTE — Progress Notes (Signed)
S: Pt returns today for brief pre-op evaluation; she has contacted a local Plastic Surgeon- Dr. Brantley Persons- who will do breast reduction but requests labs and ECG. Pt was recently seen on 03/28/12 for DM follow-up (A1c=5.8%) at which time she had a thorough exam. She has no new issues to be discussed and is compliant with medications.  O:  Filed Vitals:   04/20/12 1144  BP: 112/64  Pulse: 84  Temp: 97 F (36.1 C)  Resp: 16   GEN: In NAD; WN,WD. HEENT: [place]/AT; EOMI w/ clear conj/scl; otherwise unremarkable. NECK: No LAN or TMG.  COR: RRR. LUNGS: Normal resp rate and effort. NEURO: A&O x 3; CNs intact. Nonfocal.  ECG: Sinus rhythm w/ poor R wave progression and low voltage in precordial leads; no ST-TW changes and no ectopy.  A/P:  1. Pre-operative clearance  CBC with Differential, EKG 12-Lead  2. HTN, goal below 130/80     Labs, ECG and notes from 03/28/12 and today's visit will be faxed to [phone] (Dr. Sherald Hess).

## 2012-05-01 ENCOUNTER — Other Ambulatory Visit: Payer: Self-pay | Admitting: Family Medicine

## 2012-05-01 DIAGNOSIS — Z1231 Encounter for screening mammogram for malignant neoplasm of breast: Secondary | ICD-10-CM

## 2012-05-24 ENCOUNTER — Encounter (HOSPITAL_BASED_OUTPATIENT_CLINIC_OR_DEPARTMENT_OTHER): Payer: Self-pay | Admitting: *Deleted

## 2012-05-24 NOTE — Progress Notes (Signed)
Pt was told she would stay overnight to watch blood sugars-to bring all meds and overnight bag-to see dr c in am-will come by for bmet

## 2012-05-25 ENCOUNTER — Encounter (HOSPITAL_BASED_OUTPATIENT_CLINIC_OR_DEPARTMENT_OTHER)
Admission: RE | Admit: 2012-05-25 | Discharge: 2012-05-25 | Disposition: A | Discharge status: Home / Self Care | Payer: BC Managed Care – PPO | Source: Ambulatory Visit | Attending: Plastic Surgery | Admitting: Plastic Surgery

## 2012-05-25 LAB — BASIC METABOLIC PANEL
BUN: 15 mg/dL (ref 6–23)
CO2: 28 mEq/L (ref 19–32)
Calcium: 9.6 mg/dL (ref 8.4–10.5)
Chloride: 104 mEq/L (ref 96–112)
Creatinine, Ser: 0.81 mg/dL (ref 0.50–1.10)
GFR calc Af Amer: >90 mL/min (ref >90)
GFR calc non Af Amer: 78 mL/min — ABNORMAL LOW (ref >90)
Glucose, Bld: 90 mg/dL (ref 70–99)
Potassium: 4.3 mEq/L (ref 3.5–5.1)
Sodium: 141 mEq/L (ref 135–145)

## 2012-05-28 ENCOUNTER — Other Ambulatory Visit: Payer: Self-pay | Admitting: Plastic Surgery

## 2012-05-29 ENCOUNTER — Encounter (HOSPITAL_BASED_OUTPATIENT_CLINIC_OR_DEPARTMENT_OTHER): Payer: Self-pay | Admitting: Anesthesiology

## 2012-05-29 ENCOUNTER — Ambulatory Visit (HOSPITAL_BASED_OUTPATIENT_CLINIC_OR_DEPARTMENT_OTHER)
Admission: RE | Admit: 2012-05-29 | Discharge: 2012-05-29 | Disposition: A | Discharge status: Home / Self Care | Payer: BC Managed Care – PPO | Source: Ambulatory Visit | Attending: Plastic Surgery | Admitting: Plastic Surgery

## 2012-05-29 ENCOUNTER — Encounter (HOSPITAL_BASED_OUTPATIENT_CLINIC_OR_DEPARTMENT_OTHER): Payer: Self-pay

## 2012-05-29 ENCOUNTER — Encounter (HOSPITAL_BASED_OUTPATIENT_CLINIC_OR_DEPARTMENT_OTHER)
Admission: RE | Disposition: A | Discharge status: Home / Self Care | Payer: Self-pay | Source: Ambulatory Visit | Attending: Plastic Surgery

## 2012-05-29 ENCOUNTER — Ambulatory Visit (HOSPITAL_BASED_OUTPATIENT_CLINIC_OR_DEPARTMENT_OTHER): Payer: BC Managed Care – PPO | Admitting: Anesthesiology

## 2012-05-29 DIAGNOSIS — N649 Disorder of breast, unspecified: Secondary | ICD-10-CM | POA: Insufficient documentation

## 2012-05-29 DIAGNOSIS — E119 Type 2 diabetes mellitus without complications: Secondary | ICD-10-CM | POA: Insufficient documentation

## 2012-05-29 DIAGNOSIS — I739 Peripheral vascular disease, unspecified: Secondary | ICD-10-CM | POA: Insufficient documentation

## 2012-05-29 DIAGNOSIS — Q838 Other congenital malformations of breast: Secondary | ICD-10-CM | POA: Insufficient documentation

## 2012-05-29 DIAGNOSIS — I10 Essential (primary) hypertension: Secondary | ICD-10-CM | POA: Insufficient documentation

## 2012-05-29 DIAGNOSIS — N62 Hypertrophy of breast: Secondary | ICD-10-CM

## 2012-05-29 HISTORY — PX: BREAST REDUCTION SURGERY: SHX8

## 2012-05-29 LAB — GLUCOSE, CAPILLARY
Glucose-Capillary: 84 mg/dL (ref 70–99)
Glucose-Capillary: 126 mg/dL — ABNORMAL HIGH (ref 70–99)
Glucose-Capillary: 152 mg/dL — ABNORMAL HIGH (ref 70–99)

## 2012-05-29 LAB — POCT HEMOGLOBIN-HEMACUE: Hemoglobin: 12.5 g/dL (ref 12.0–15.0)

## 2012-05-29 SURGERY — MAMMOPLASTY, REDUCTION
Anesthesia: General | Site: Breast | Laterality: Bilateral | Wound class: Clean

## 2012-05-29 MED ORDER — OXYCODONE HCL 5 MG PO TABS: 5.0000 mg | ORAL_TABLET | Freq: Once | ORAL | Status: DC | PRN

## 2012-05-29 MED ORDER — LACTATED RINGERS IV SOLN
INTRAVENOUS | Status: DC
  Administered 2012-05-29: 11:00:00 via INTRAVENOUS

## 2012-05-29 MED ORDER — MIDAZOLAM HCL 5 MG/5ML IJ SOLN
INTRAMUSCULAR | Status: DC | PRN
  Administered 2012-05-29: 1 mg via INTRAVENOUS

## 2012-05-29 MED ORDER — MORPHINE SULFATE 10 MG/ML IJ SOLN
INTRAMUSCULAR | Status: DC | PRN
  Administered 2012-05-29 (×3): 2 mg via INTRAVENOUS

## 2012-05-29 MED ORDER — BACITRACIN ZINC 500 UNIT/GM EX OINT
TOPICAL_OINTMENT | CUTANEOUS | Status: DC | PRN
  Administered 2012-05-29: 1 via TOPICAL

## 2012-05-29 MED ORDER — LIDOCAINE HCL (CARDIAC) 20 MG/ML IV SOLN
INTRAVENOUS | Status: DC | PRN
  Administered 2012-05-29: 50 mg via INTRAVENOUS

## 2012-05-29 MED ORDER — SUCCINYLCHOLINE CHLORIDE 20 MG/ML IJ SOLN
INTRAMUSCULAR | Status: DC | PRN
  Administered 2012-05-29: 100 mg via INTRAVENOUS

## 2012-05-29 MED ORDER — MIDAZOLAM HCL 2 MG/2ML IJ SOLN: 1.0000 mg | INTRAMUSCULAR | Status: DC | PRN

## 2012-05-29 MED ORDER — ONDANSETRON HCL 4 MG/2ML IJ SOLN
INTRAMUSCULAR | Status: DC | PRN
  Administered 2012-05-29: 4 mg via INTRAVENOUS

## 2012-05-29 MED ORDER — CEFAZOLIN SODIUM 1-5 GM-% IV SOLN
1.0000 g | Freq: Once | INTRAVENOUS | Status: AC
  Administered 2012-05-29: 2 g via INTRAVENOUS

## 2012-05-29 MED ORDER — OXYCODONE-ACETAMINOPHEN 5-325 MG PO TABS: 1.0000 | ORAL_TABLET | ORAL | Status: DC | PRN

## 2012-05-29 MED ORDER — EPHEDRINE SULFATE 50 MG/ML IJ SOLN
INTRAMUSCULAR | Status: DC | PRN
  Administered 2012-05-29: 15 mg via INTRAVENOUS

## 2012-05-29 MED ORDER — DEXAMETHASONE SODIUM PHOSPHATE 4 MG/ML IJ SOLN
INTRAMUSCULAR | Status: DC | PRN
  Administered 2012-05-29: 10 mg via INTRAVENOUS

## 2012-05-29 MED ORDER — PROPOFOL 10 MG/ML IV BOLUS
INTRAVENOUS | Status: DC | PRN
Start: 2012-05-29 — End: 2012-05-29
  Administered 2012-05-29: 150 mg via INTRAVENOUS

## 2012-05-29 MED ORDER — CEFAZOLIN SODIUM 1-5 GM-% IV SOLN: 1.0000 g | Freq: Three times a day (TID) | INTRAVENOUS | Status: DC

## 2012-05-29 MED ORDER — BUPIVACAINE LIPOSOME 1.3 % IJ SUSP
INTRAMUSCULAR | Status: DC | PRN
  Administered 2012-05-29: 20 mL

## 2012-05-29 MED ORDER — LACTATED RINGERS IV SOLN
INTRAVENOUS | Status: DC
  Administered 2012-05-29: 100 mL/h via INTRAVENOUS

## 2012-05-29 MED ORDER — ONDANSETRON HCL 4 MG/2ML IJ SOLN
4.0000 mg | Freq: Four times a day (QID) | INTRAMUSCULAR | Status: AC | PRN
  Administered 2012-05-29: 4 mg via INTRAVENOUS

## 2012-05-29 MED ORDER — FENTANYL CITRATE 0.05 MG/ML IJ SOLN: 50.0000 ug | INTRAMUSCULAR | Status: DC | PRN

## 2012-05-29 MED ORDER — FENTANYL CITRATE 0.05 MG/ML IJ SOLN
INTRAMUSCULAR | Status: DC | PRN
  Administered 2012-05-29: 100 ug via INTRAVENOUS

## 2012-05-29 MED ORDER — HYDROMORPHONE HCL PF 1 MG/ML IJ SOLN
0.2500 mg | INTRAMUSCULAR | Status: DC | PRN
Start: 2012-05-29 — End: 2012-05-29
  Administered 2012-05-29: 0.5 mg via INTRAVENOUS

## 2012-05-29 MED ORDER — LIDOCAINE-EPINEPHRINE 1 %-1:100000 IJ SOLN
INTRAMUSCULAR | Status: DC | PRN
  Administered 2012-05-29: 40 mL

## 2012-05-29 MED ORDER — OXYCODONE HCL 5 MG/5ML PO SOLN: 5.0000 mg | Freq: Once | ORAL | Status: DC | PRN

## 2012-05-29 MED ORDER — PROMETHAZINE HCL 25 MG RE SUPP: 25.0000 mg | Freq: Four times a day (QID) | RECTAL | Status: DC | PRN

## 2012-05-29 MED ORDER — PHENYLEPHRINE HCL 10 MG/ML IJ SOLN
10.0000 mg | INTRAVENOUS | Status: DC | PRN
  Administered 2012-05-29: 50 ug/min via INTRAVENOUS

## 2012-05-29 SURGICAL SUPPLY — 55 items
BANDAGE GAUZE ELAST BULKY 4 IN (GAUZE/BANDAGES/DRESSINGS) ×4 IMPLANT
BENZOIN TINCTURE PRP APPL 2/3 (GAUZE/BANDAGES/DRESSINGS) ×4 IMPLANT
BLADE KNIFE PERSONA 10 (BLADE) ×8 IMPLANT
BLADE KNIFE PERSONA 15 (BLADE) ×8 IMPLANT
CANISTER SUCTION 1200CC (MISCELLANEOUS) ×2 IMPLANT
CAP BOUFFANT 24 BLUE NURSES (PROTECTIVE WEAR) ×2 IMPLANT
CLOTH BEACON ORANGE TIMEOUT ST (SAFETY) ×2 IMPLANT
COVER MAYO STAND STRL (DRAPES) ×2 IMPLANT
COVER TABLE BACK 60X90 (DRAPES) ×2 IMPLANT
DECANTER SPIKE VIAL GLASS SM (MISCELLANEOUS) ×4 IMPLANT
DRAIN CHANNEL 10F 3/8 F FF (DRAIN) ×4 IMPLANT
DRAPE LAPAROSCOPIC ABDOMINAL (DRAPES) ×2 IMPLANT
DRSG EMULSION OIL 3X3 NADH (GAUZE/BANDAGES/DRESSINGS) ×4 IMPLANT
DRSG PAD ABDOMINAL 8X10 ST (GAUZE/BANDAGES/DRESSINGS) ×4 IMPLANT
ELECT NEEDLE TIP 2.8 STRL (NEEDLE) IMPLANT
ELECT REM PT RETURN 9FT ADLT (ELECTROSURGICAL) ×2
ELECTRODE REM PT RTRN 9FT ADLT (ELECTROSURGICAL) ×1 IMPLANT
EVACUATOR SILICONE 100CC (DRAIN) ×4 IMPLANT
FILTER 7/8 IN (FILTER) ×2 IMPLANT
GLOVE BIO SURGEON STRL SZ 6.5 (GLOVE) ×2 IMPLANT
GLOVE BIO SURGEON STRL SZ7 (GLOVE) ×4 IMPLANT
GLOVE ECLIPSE 6.5 STRL STRAW (GLOVE) ×2 IMPLANT
GOWN PREVENTION PLUS XLARGE (GOWN DISPOSABLE) ×6 IMPLANT
NEEDLE HYPO 25X1 1.5 SAFETY (NEEDLE) ×4 IMPLANT
NEEDLE SPNL 18GX3.5 QUINCKE PK (NEEDLE) ×2 IMPLANT
NS IRRIG 1000ML POUR BTL (IV SOLUTION) ×4 IMPLANT
PACK BASIN DAY SURGERY FS (CUSTOM PROCEDURE TRAY) ×2 IMPLANT
PIN SAFETY STERILE (MISCELLANEOUS) ×2 IMPLANT
SCRUB PCMX 4 OZ (MISCELLANEOUS) ×2 IMPLANT
SCRUB TECHNI CARE SURGICAL (MISCELLANEOUS) IMPLANT
SLEEVE SCD COMPRESS KNEE MED (MISCELLANEOUS) ×2 IMPLANT
SPECIMEN JAR MEDIUM (MISCELLANEOUS) ×4 IMPLANT
SPECIMEN JAR X LARGE (MISCELLANEOUS) IMPLANT
SPONGE GAUZE 4X4 12PLY (GAUZE/BANDAGES/DRESSINGS) ×4 IMPLANT
SPONGE LAP 18X18 X RAY DECT (DISPOSABLE) ×8 IMPLANT
STAPLER VISISTAT 35W (STAPLE) ×4 IMPLANT
STRIP CLOSURE SKIN 1/2X4 (GAUZE/BANDAGES/DRESSINGS) ×4 IMPLANT
SUT ETHILON 3 0 PS 1 (SUTURE) ×2 IMPLANT
SUT MNCRL AB 3-0 PS2 18 (SUTURE) ×8 IMPLANT
SUT MNCRL AB 4-0 PS2 18 (SUTURE) ×4 IMPLANT
SUT MON AB 5-0 PS2 18 (SUTURE) ×6 IMPLANT
SUT PROLENE 2 0 CT2 30 (SUTURE) ×2 IMPLANT
SUT PROLENE 3 0 PS 1 (SUTURE) ×4 IMPLANT
SUT QUILL PDO 2-0 (SUTURE) ×4 IMPLANT
SYR BULB IRRIGATION 50ML (SYRINGE) ×4 IMPLANT
SYR CONTROL 10ML LL (SYRINGE) ×6 IMPLANT
TOWEL OR 17X24 6PK STRL BLUE (TOWEL DISPOSABLE) ×6 IMPLANT
TOWEL OR NON WOVEN STRL DISP B (DISPOSABLE) ×2 IMPLANT
TRAY DSU PREP LF (CUSTOM PROCEDURE TRAY) ×2 IMPLANT
TRAY FOLEY CATH 14FR (SET/KITS/TRAYS/PACK) ×2 IMPLANT
TUBE CONNECTING 20X1/4 (TUBING) ×2 IMPLANT
UNDERPAD 30X30 INCONTINENT (UNDERPADS AND DIAPERS) ×4 IMPLANT
VAC PENCILS W/TUBING CLEAR (MISCELLANEOUS) ×2 IMPLANT
WATER STERILE IRR 1000ML POUR (IV SOLUTION) IMPLANT
YANKAUER SUCT BULB TIP NO VENT (SUCTIONS) ×2 IMPLANT

## 2012-05-29 NOTE — Transfer of Care (Signed)
Immediate Anesthesia Transfer of Care Note  Patient: Victoria Mullen  Procedure(s) Performed: Procedure(s) (LRB) with comments: MAMMARY REDUCTION  (BREAST) (Bilateral)  Patient Location: PACU  Anesthesia Type:General  Level of Consciousness: awake and alert   Airway & Oxygen Therapy: Patient Spontanous Breathing and Patient connected to face mask oxygen  Post-op Assessment: Report given to PACU RN and Post -op Vital signs reviewed and stable  Post vital signs: Reviewed and stable  Complications: No apparent anesthesia complications

## 2012-05-29 NOTE — H&P (Signed)
  H&P faxed to surgical center.  -History and Physical Reviewed  -Patient has been re-examined  -No change in the plan of care  CONTOGIANNIS,MARY A    

## 2012-05-29 NOTE — Anesthesia Preprocedure Evaluation (Signed)
Anesthesia Evaluation  Patient identified by MRN, date of birth, ID band Patient awake    Reviewed: Allergy & Precautions, H&P , NPO status , Patient's Chart, lab work & pertinent test results  Airway Mallampati: II  Neck ROM: full    Dental   Pulmonary          Cardiovascular hypertension, + Peripheral Vascular Disease     Neuro/Psych  Headaches,    GI/Hepatic GERD-  ,  Endo/Other  diabetes, Type 2  Renal/GU      Musculoskeletal   Abdominal   Peds  Hematology   Anesthesia Other Findings   Reproductive/Obstetrics                           Anesthesia Physical Anesthesia Plan  ASA: III  Anesthesia Plan: General   Post-op Pain Management:    Induction: Intravenous  Airway Management Planned: Oral ETT  Additional Equipment:   Intra-op Plan:   Post-operative Plan: Extubation in OR  Informed Consent: I have reviewed the patients History and Physical, chart, labs and discussed the procedure including the risks, benefits and alternatives for the proposed anesthesia with the patient or authorized representative who has indicated his/her understanding and acceptance.     Plan Discussed with: CRNA and Surgeon  Anesthesia Plan Comments:         Anesthesia Quick Evaluation

## 2012-05-29 NOTE — Brief Op Note (Signed)
05/29/2012  4:04 PM  PATIENT:  Victoria Mullen  61 y.o. female  PRE-OPERATIVE DIAGNOSIS:1) BILATERAL MACROMASTIA 2) Bilateral Axillary Accessory Breast Tissue 3) Suspicious Skin Lesion Right Breast  POST-OPERATIVE DIAGNOSIS:  Same  PROCEDURE:  1) Bilateral Reduction Mammaplasties 2) Excision of Bilateral Axillary Accessory Breast Tissue 3) Excision of 1.8 cm Suspicious Right Breast Skin lesion  SURGEON:  Surgeon(s) and Role:    * Karie Fetch, MD - Primary  ASSISTANTS: Bonita Cox, RNFA   ANESTHESIA:   general  EBL:  Total I/O In: 2000 [I.V.:2000] Out: 400 [Urine:400]  BLOOD ADMINISTERED:none  DRAINS: (55F) Jackson-Pratt drain(s) with closed bulb suction in the Breasts   LOCAL MEDICATIONS USED:  Exparel  SPECIMEN:  Source of Specimen:  Bilateral Breasts  DISPOSITION OF SPECIMEN:  PATHOLOGY  COUNTS:  YES  DICTATION: .Other Dictation: Dictation Number [id number]  PLAN OF CARE: Admit for overnight observation  PATIENT DISPOSITION:  PACU - hemodynamically stable.   Delay start of Pharmacological VTE agent (>24hrs) due to surgical blood loss or risk of bleeding: not applicable

## 2012-05-29 NOTE — Anesthesia Procedure Notes (Signed)
Procedure Name: Intubation Performed by: Yezenia Fredrick W Pre-anesthesia Checklist: Patient identified, Timeout performed, Emergency Drugs available, Suction available and Patient being monitored Patient Re-evaluated:Patient Re-evaluated prior to inductionOxygen Delivery Method: Circle system utilized Preoxygenation: Pre-oxygenation with 100% oxygen Intubation Type: IV induction Ventilation: Mask ventilation without difficulty Laryngoscope Size: Miller and 2 Grade View: Grade II Tube type: Oral Tube size: 7.0 mm Number of attempts: 1 Airway Equipment and Method: Stylet Placement Confirmation: breath sounds checked- equal and bilateral and positive ETCO2 Secured at: 23 cm Tube secured with: Tape Dental Injury: Teeth and Oropharynx as per pre-operative assessment      

## 2012-05-29 NOTE — Anesthesia Postprocedure Evaluation (Signed)
Anesthesia Post Note  Patient: Victoria Mullen  Procedure(s) Performed: Procedure(s) (LRB): MAMMARY REDUCTION  (BREAST) (Bilateral)  Anesthesia type: General  Patient location: PACU  Post pain: Pain level controlled and Adequate analgesia  Post assessment: Post-op Vital signs reviewed, Patient's Cardiovascular Status Stable, Respiratory Function Stable, Patent Airway and Pain level controlled  Last Vitals:  Filed Vitals:   05/29/12 1700  BP: 111/57  Pulse: 97  Temp:   Resp: 17    Post vital signs: Reviewed and stable  Level of consciousness: awake, alert  and oriented  Complications: No apparent anesthesia complications

## 2012-05-30 ENCOUNTER — Encounter (HOSPITAL_BASED_OUTPATIENT_CLINIC_OR_DEPARTMENT_OTHER): Payer: Self-pay | Admitting: Plastic Surgery

## 2012-06-15 ENCOUNTER — Telehealth: Payer: Self-pay

## 2012-06-15 MED ORDER — ROSUVASTATIN CALCIUM 20 MG PO TABS: 20.0000 mg | ORAL_TABLET | Freq: Every day | ORAL | Status: DC

## 2012-06-15 NOTE — Telephone Encounter (Signed)
PT STATES DR MCPHERSON HAVE HER ON CRESTOR AND SHE IS ALMOST OUT. WOULD LIKE TO KNOW IF THE DR WANTED TO CHANGE HER DOSAGE OR SHOULD SHE CONTINUE TAKING THE SAME AMOUNT. PLEASE CALL [phone]

## 2012-06-15 NOTE — Telephone Encounter (Signed)
I e-prescribed Rosuvastatin (Crestor) 20 mg to [facility]- #90 w/ 1 refill. This is the dose pt was on before; I want her to continue this medication at same dose and we will need to recheck her lipids at next office visit. Please contact pt to let her know; I attempted to call but there was no answer.

## 2012-06-17 NOTE — Telephone Encounter (Signed)
Called pt no answer °

## 2012-06-17 NOTE — Telephone Encounter (Signed)
Patient advised.

## 2012-07-05 NOTE — Op Note (Signed)
NAMEJENNETT, Victoria Mullen               ACCOUNT NO.:  [id number]  MEDICAL RECORD NO.:  [id number]  LOCATION:                           FACILITY:    PHYSICIAN:  Brantley Persons, M.D.DATE OF BIRTH:  [birth year]  DATE OF PROCEDURE:  05/29/2012 DATE OF DISCHARGE:  05/29/2012                              OPERATIVE REPORT   PREOPERATIVE DIAGNOSES: 1. Bilateral macromastia. 2. Accessory breast tissue bilateral axillary/lateral breast areas. 3. Suspicious skin lesion, right breast.  POSTOPERATIVE DIAGNOSES: 1. Bilateral macromastia. 2. Accessory breast tissue bilateral axillary/lateral breast areas. 3. Suspicious skin lesion, right breast.  PROCEDURES: 1. Bilateral reduction mammoplasties. 2. Excision of accessory breast tissue bilateral axillary/lateral     breast areas. 3. Excision of 2.1 cm suspicious skin lesion, right breast, with     complex skin closure.  ATTENDING SURGEON:  Brantley Persons, M.D.  ANESTHESIA:  General.  COMPLICATIONS:  None.  INDICATIONS FOR THE PROCEDURE:  The patient is a 62 year old Caucasian female who has bilateral macromastia that is clinically symptomatic. Additionally, she has accessory breast tissue present in the bilateral axillary areas.  Lastly, she also has a suspicious lesion present on the right breast. She presents today for bilateral reduction mammoplasty surgery along with excision of the accessory breast tissue from the bilateral axillary/lateral breast areas and excision of the suspicious skin lesion from the right breast.  PROCEDURE:  The patient was marked in preop holding area in the pattern of [place] for the future bilateral reduction mammoplasties. The areas of accessory breast tissue and suspicious skin lesion were also marked.  She was then taken back to the OR and placed on table in supine position.  After adequate generalanesthesia was obtained, the patient's chest was prepped with Technicare and draped in sterile fashion.  Both  Breast Reductions were performed in the following similar manner. The nipple-areolar complexes(NAC) were marked with the 45 mm nipple marker. The skin was then de-epithelialized around the NAC and brought down to the inframammary crease (IMC)in the inferior pedicle pattern. Next the medial, superior, and lateral skin flaps elevated down to the chest wall.  The excess fat and glandular tissue removed from the inferior pedicle.  The nipple-areolar complex was examined and found to be pink and viable.  The wound was irrigated with saline irrigation.  Meticulous hemostasis was obtained with the Bovie electrocautery.  The accessory breast tissue was removed from the bilateral axillary and lateral breast using the Bovie electrocautery dissection.  Meticulous hemostasis was obtained. The wound was again irrigated with saline irrigation. The inferior pedicle was centralized using 3.0 Prolene sutures. A #10 JP flat fully fluted drain was placed into the wound.  The skin flaps brought together at the inverted-T junction with a 2-0 Prolene suture.  The incisions were stapled for temporary closure. The breasts were compared and found to have good shape and symmetry. The incisions were then closed from the medial aspect of the JP drain to the medial [facility] placing a few 3-0 Monocryl sutures to tack together the dermal layer and then both the dermal and cuticular layer were closed in a single layer using a Quill 2.0 PDO barbed suture.Lateral to the JP drain the incision was closed in the  dermal layer with interrupted 3.0 Monocryl sutures followed by a 3-0 Monocryl running intracuticular stitch on skin. The vertical incision was closed in the dermal layer using 3-0 Monocryl suture.  The patient was placed in the upright position.  The future location of the nipple-areolar complexes was marked on both breast mounds using the 45 mm nipple marker. She was then placed back in the recumbent position.  Both of the NACs were then  brought out onto the breast mounds in the following similar manner. The skin was incised as marked and removed full thickness into the subcutaneous tissue. The NACs were pink and viable, brought out through this aperture and sewn in place using 4.0 Monocryl in the dermal layer followed by a 5.0 Monocryl suture in the cuticular layer. The 5-0 Monocryl suture was then brought down to close the cuticular layer of the vertical incision. The JP drain was sewn in place using 3-0 nylon suture. 1.3% Exparel (total 266 mg) was infiltrated for postsurgical pain control by injecting some of the Exparel into the sheath of the pectoralis major muscle and then additionally into the skin and soft tissues of the incision as well as circumferentially around breast at the chest wall.  The incisions were dressed with benzoin, Steri-Strips, and the nipples additionally with bacitracin ointment and Adaptic. 4x4s were placed over the incisions and ABD pads in the axillary areas.  She was then placed into light postoperative support bra.  There are no complications.  The patient tolerated the procedure well.  The final needle, sponge counts reported to be correct at the end of the case.  The patient was then extubated and taken to recovery room in stable condition.  She was also recovered without complications.  The patient does have a history of diabetes, so she was initially admitted to the RCC to be monitored for the night to watch her blood sugars. Several hours after surgery, she was doing very well.  On my post-op visit in the RCC several hours later, her blood sugar was under good control.  The patient was ambulating well and not having any difficulty with nausea or vomiting.  She had great pain control with the Exparel.  After discussing possible discharge at length with the patient and her husband, I decided to discharge the patient home. She and her husband are very skilled in monitoring her blood sugars and  administering proper medications for her Diabetes. The patient and her husband were given proper postsurgical instructions including care of the JP drains and monitoring of her blood glucose. The patient was then discharged home in the care of her husband in stable condition. Followup  appointment will be within the next couple of days in the office.  However, she will call tonight should she have any issues or concerns.   __________________________________ Brantley Persons, M.D.     MC/MEDQ  D:  07/05/2012  T:  07/05/2012  Job:  [id number]

## 2012-07-05 NOTE — Op Note (Signed)
NAME:  Victoria Mullen, Victoria Mullen                    ACCOUNT NO.:  MEDICAL RECORD NO.:  [id number]  LOCATION:                                 FACILITY:  PHYSICIAN:  Brantley Persons, M.D.DATE OF BIRTH:  09-09-1950  DATE OF PROCEDURE:  06/29/2012 DATE OF DISCHARGE:                              OPERATIVE REPORT   PREOPERATIVE DIAGNOSES: 1. Bilateral macromastia. 2. Accessory breast tissue bilateral axillae/lateral breast. 3. Suspicious skin lesion, right breast.  POSTOPERATIVE DIAGNOSES: 1. Bilateral macromastia. 2. Accessory breast tissue bilateral axillae/lateral breast. 3. Suspicious skin lesion, right breast.  PROCEDURE: 1. Bilateral reduction mammoplasties. 2. Excision of accessory breast tissue bilateral axillae/lateral     breast. 3. Excision of suspicious skin lesion, right breast with complex skin     closure.  ATTENDING SURGEON:  Brantley Persons, M.D.  ANESTHESIA:  General.  COMPLICATIONS:  None.  INDICATIONS FOR THE PROCEDURE: The patient is a 62 year old Caucasian female who has bilateral macromastia that is clinically symptomatic.  She additionally has accessory breast tissue in the axillary areas and little along the lateral breast bilaterally.  Lastly she also has suspicious skin lesion on the right  DICTATION ENDS HERE          ______________________________ Brantley Persons, M.D.     MC/MEDQ  D:  07/05/2012  T:  07/05/2012  Job:  [id number]

## 2012-09-26 ENCOUNTER — Ambulatory Visit: Payer: BC Managed Care – PPO | Admitting: Family Medicine

## 2012-10-24 IMAGING — CT CT HEAD WO/W CM
3 of 4 series · 17 of 30 positions shown, 19 images · IV contrast (omnipaque)
Comparison: MRI 06/03/2011, CT 06/03/2011

CLINICAL DATA: Tumor resection today.  Meningioma.

CT HEAD WITHOUT AND WITH CONTRAST
TECHNIQUE: Contiguous axial images were obtained from the base of
the skull through the vertex without and with intravenous contrast.
Contrast: 75mL OMNIPAQUE IOHEXOL 300 MG/ML IV SOLN

[Series 3: head w/o · axial · non-contrast · 0.49mm/px · z∈[+104,+204]mm · 5 of 30 slices shown]
[im 5/30  brain]
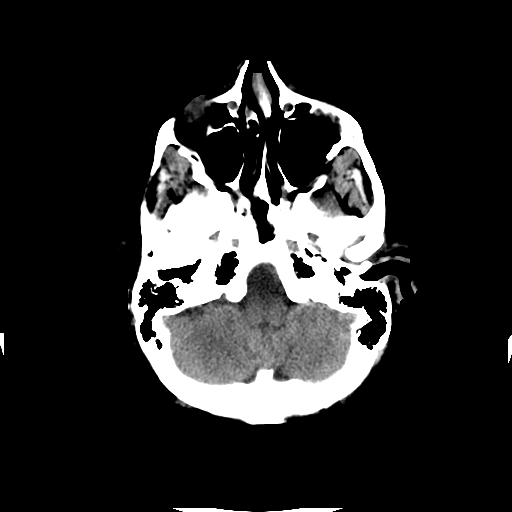
[im 10/30  brain]
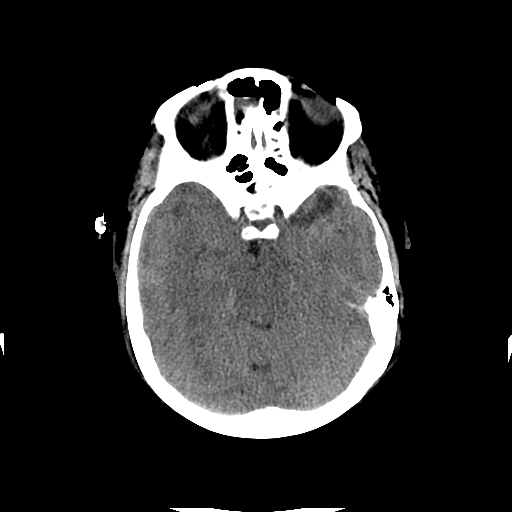
[im 15/30  brain]
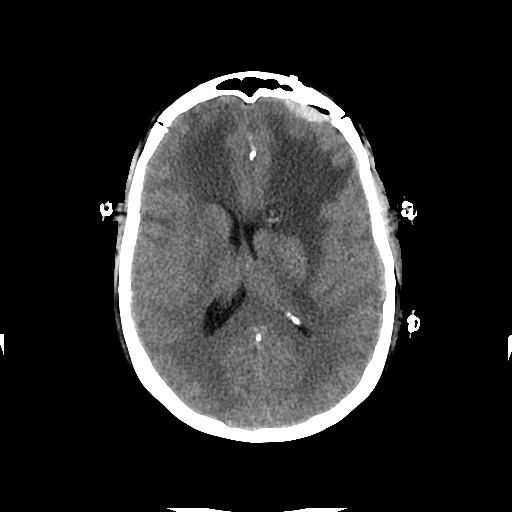
[im 20/30  brain]
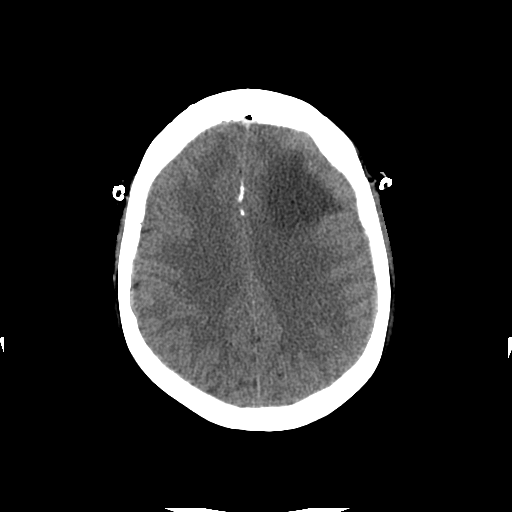
[im 25/30  brain]
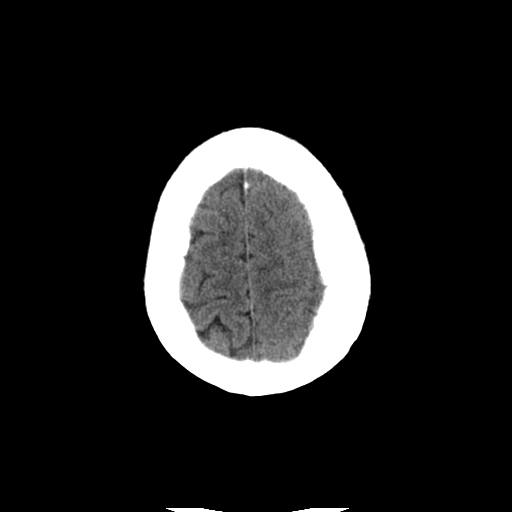

[Series 6: head w contrast · axial · 0.49mm/px · z∈[+104,+204]mm · 6 of 30 slices shown, 8 images]
[im 5/30  brain]
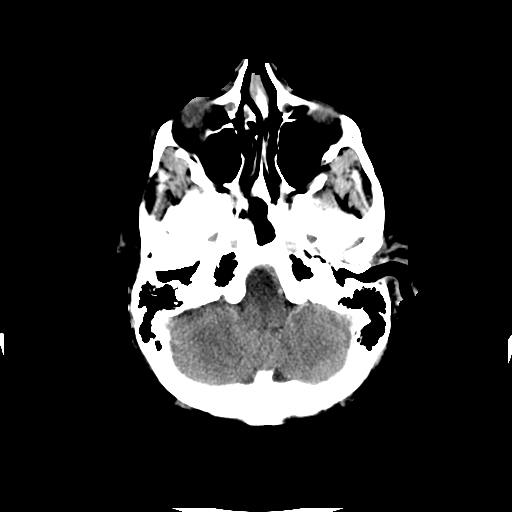
[im 5/30  bone]
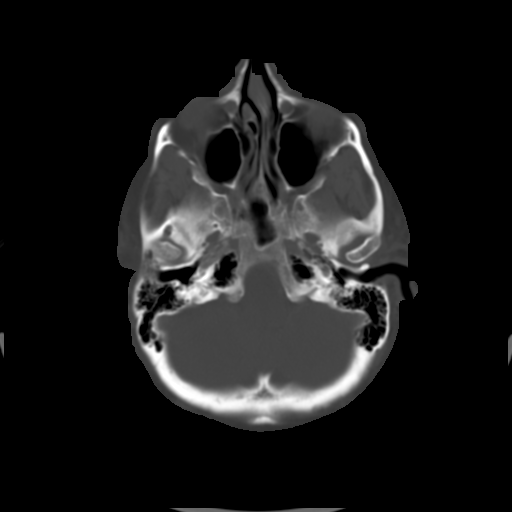
[im 9/30  brain]
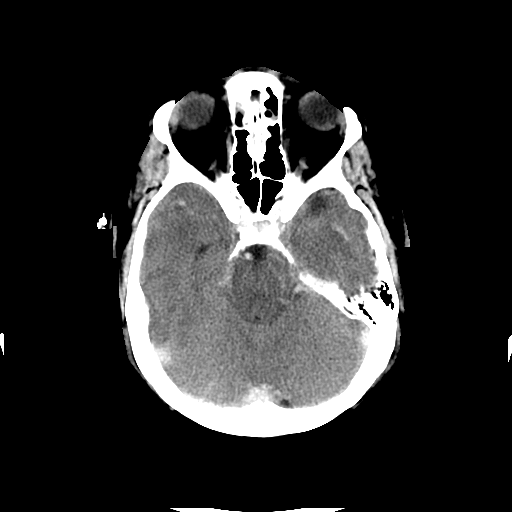
[im 13/30  brain]
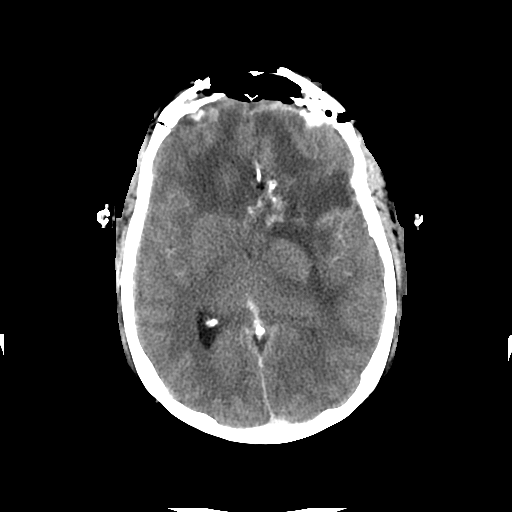
[im 17/30  brain]
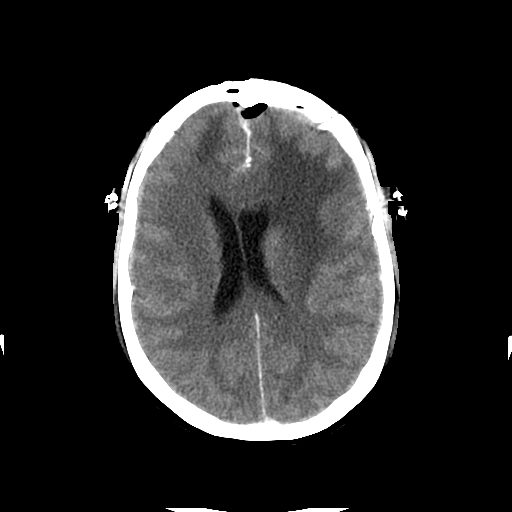
[im 21/30  brain]
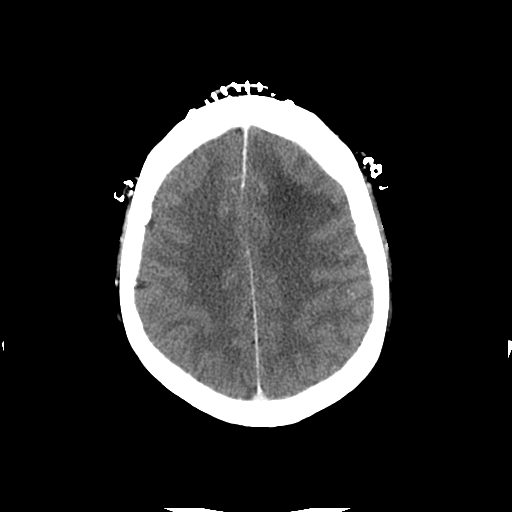
[im 21/30  bone]
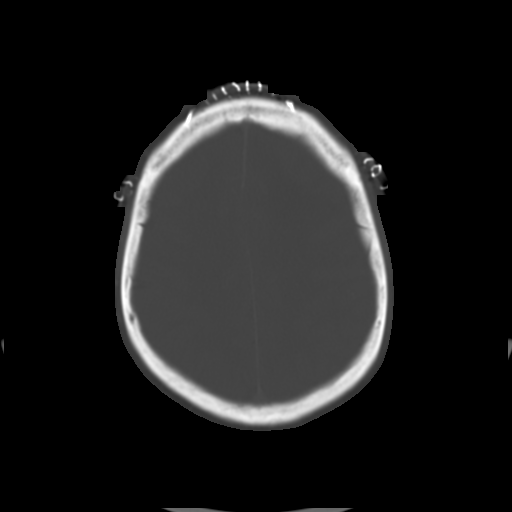
[im 25/30  brain]
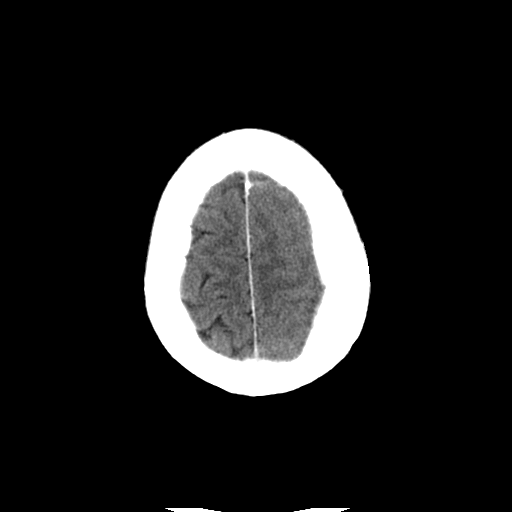

[Series 7: head w/ cm bone · axial · 0.49mm/px · z∈[+104,+204]mm · 6 of 30 slices shown]
[im 5/30  bone]
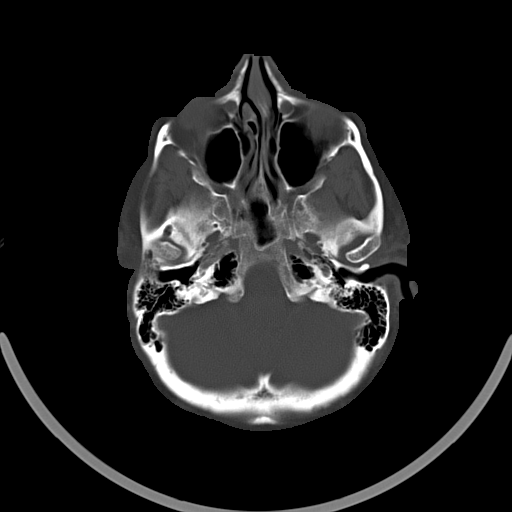
[im 9/30  bone]
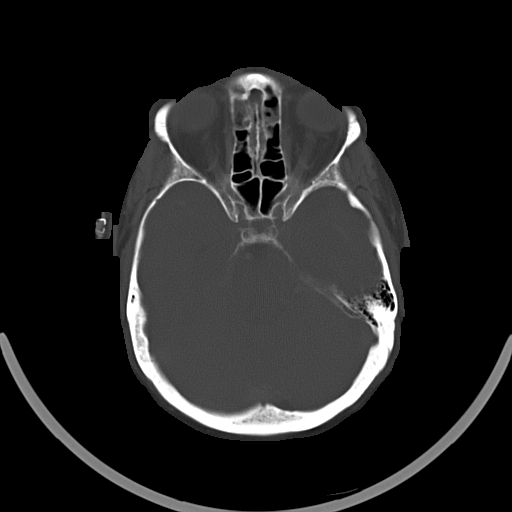
[im 13/30  bone]
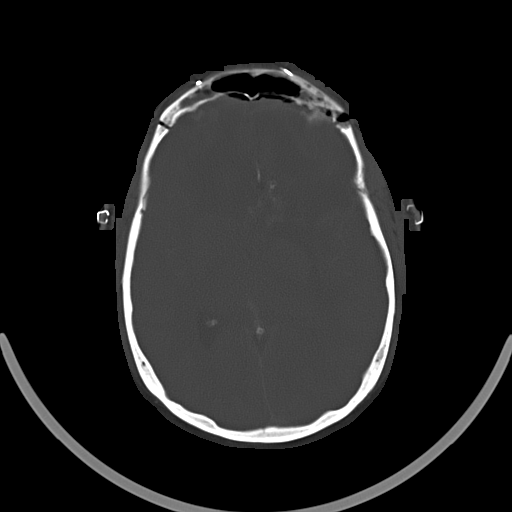
[im 17/30  bone]
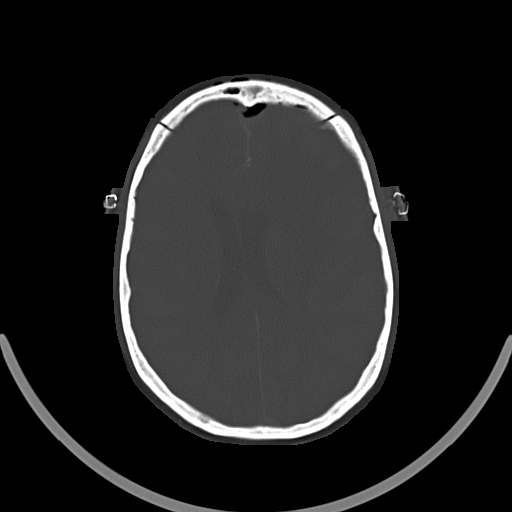
[im 21/30  bone]
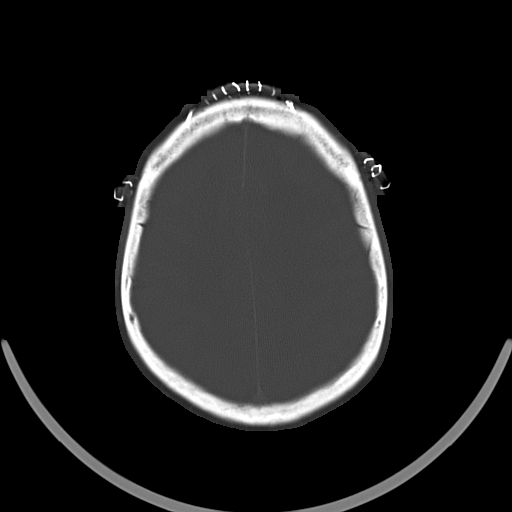
[im 25/30  bone]
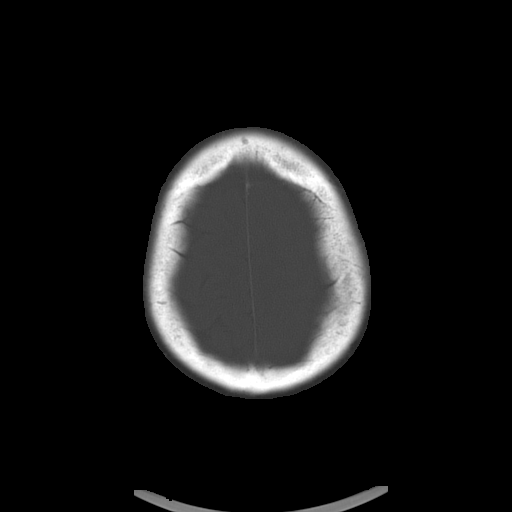

[17 of 30 positions shown; findings below may reference images not displayed]

FINDINGS: Bifrontal craniotomy for tumor resection.  There is fluid
in the frontal sinus.

Postop subdural hematoma left frontal region measuring 8 mm in
thickness.  There is some residual calcified tumor present in the
left inferior frontal region measuring approximately 15 mm.  There
is a mild amount of associated high density in this area which
appears to be due to postop hemorrhage.  No enhancing residual
tumor is identified.  There is extensive bifrontal edema.  The
large cyst in the left frontal lobe has been resected.  There is
mass effect on the left frontal horn with 8 mm midline shift left
to right.  No definite acute infarct.  Ventricles are not enlarged.
IMPRESSION: Bifrontal craniotomy for meningioma resection.  There appears be
some residual calcified tumor in the left inferior frontal lobe
with some mild associated postop hemorrhage.  Extensive frontal
lobe edema is present with 8 mm midline shift toward the right.

Small left frontal subdural hematoma and mild pneumocephalus are
present.

## 2012-12-16 ENCOUNTER — Other Ambulatory Visit: Payer: Self-pay | Admitting: Family Medicine

## 2013-01-20 ENCOUNTER — Other Ambulatory Visit: Payer: Self-pay | Admitting: Physician Assistant

## 2013-07-07 ENCOUNTER — Other Ambulatory Visit: Payer: Self-pay | Admitting: Family Medicine

## 2016-06-20 DIAGNOSIS — R569 Unspecified convulsions: Secondary | ICD-10-CM

## 2016-06-20 HISTORY — DX: Unspecified convulsions: R56.9

## 2016-10-21 ENCOUNTER — Encounter: Payer: Self-pay | Admitting: Gastroenterology

## 2016-10-27 ENCOUNTER — Ambulatory Visit (INDEPENDENT_AMBULATORY_CARE_PROVIDER_SITE_OTHER): Payer: BLUE CROSS/BLUE SHIELD | Admitting: Gastroenterology

## 2016-10-27 ENCOUNTER — Encounter (INDEPENDENT_AMBULATORY_CARE_PROVIDER_SITE_OTHER): Payer: Self-pay

## 2016-10-27 ENCOUNTER — Encounter: Payer: Self-pay | Admitting: Gastroenterology

## 2016-10-27 VITALS — BP 110/70 | HR 68 | Ht 63.0 in | Wt 151.5 lb

## 2016-10-27 DIAGNOSIS — Z1211 Encounter for screening for malignant neoplasm of colon: Secondary | ICD-10-CM

## 2016-10-27 DIAGNOSIS — K219 Gastro-esophageal reflux disease without esophagitis: Secondary | ICD-10-CM

## 2016-10-27 DIAGNOSIS — R1013 Epigastric pain: Secondary | ICD-10-CM

## 2016-10-27 DIAGNOSIS — R6881 Early satiety: Secondary | ICD-10-CM | POA: Diagnosis not present

## 2016-10-27 NOTE — Progress Notes (Signed)
HPI :  66 y/o female with a history of DM, fatty liver disease, history of GERD, here for a new patient evaluation for abdominal pain / GERD  Abdominal pain is located in the epigastric area, described as a pressure like sensation. She thinks ongoing for about a month or so. She reports it bothers her when she eats. She feels "food sits" there and doesn't go down too well. Pain does not radiate. She reports discomfort will go away after a few hours until she eats again and has more pain. She has a lot of belching with this. She thinks her heartburn is well controlled right now. She is taking omeprazole 20mg  q AM and 150mg  zantac at night, she has been on this regimen for a while. She feels full easily after she eats, doesn't matter how much she eeats. She denies dysphagia.   She denies any problems with her bowels. She has DM. She reports A1c is around 6.8. Prevously on insulin, only on metformin at this time.   Labs from April 2018 at [facility] in care everywhere: - WBC 9.1, Hgb 12.9, plt 298 - ALT 16, AST 13, AP 65, bil 0.2 Normal BMET  She had negative H pylori breath test as part of her workup. She had held omeprazole for 2 weeks prior to this test.   She's had no prior upper endoscopy. She's had a history of cholecystectomy. She takes ibuprofen daily for headaches, at least 800mg  per day.  No prior colonoscopy. She is scheduled for a stool test next week for screening per primary care. No blood in the stools. No FH of colon cancer. No FH of stomach cancer. She wants to avoid a colonoscopy if possible.     Past Medical History:  Diagnosis Date  . 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 Dysrhythmia   . Essential hypertension, benign   . Fatty liver disease, nonalcoholic   . GERD (gastroesophageal reflux disease)   . Hypercholesteremia   . Laryngopharyngeal reflux (LPR)   . Melanocytic nevi of trunk    with atypia  .  Meningioma [facility]) 12/ 14/12  . Microalbuminuria   . Migraine    w/aura  . Tachycardia, unspecified    only on sleep study  . Type II or unspecified type diabetes mellitus without mention of complication, not stated as uncontrolled    with peripheral neuropathy  . Vitamin D insufficiency      Past Surgical History:  Procedure Laterality Date  . [place] STUDY  03/05/2012   Procedure: [place] STUDY;  Surgeon: Melissa Montane, MD;  Location: [facility] ENDOSCOPY;  Service: Endoscopy;  Laterality: N/A;  . BRAIN SURGERY    . BREAST REDUCTION SURGERY  05/29/2012   Procedure: MAMMARY REDUCTION  (BREAST);  Surgeon: Charlene Brooke, MD;  Location: [place];  Service: Plastics;  Laterality: Bilateral;  . CHOLECYSTECTOMY  1990's  . CRANIOTOMY  06/09/2011   Procedure: CRANIOTOMY TUMOR EXCISION;  Surgeon: Winfield Cunas;  Location: [place] NEURO ORS;  Service: Neurosurgery;  Laterality: N/A;  Bifrontal Craniotomy for Excision of Tumor  . DILATION AND CURETTAGE OF UTERUS    . ESOPHAGEAL MANOMETRY  03/05/2012   Procedure: ESOPHAGEAL MANOMETRY (EM);  Surgeon: Melissa Montane, MD;  Location: [facility] ENDOSCOPY;  Service: Endoscopy;  Laterality: N/A;  Spoke with Dr Janace Hoard nruse and patient about adding the Manometry.   Marland Kitchen UTERINE FIBROID SURGERY  ~ 2009  Family History  Problem Relation Age of Onset  . Cancer Mother   . Diabetes Mother   . Diabetes Father   . Stroke Father   . Hypertension Father   . Diabetes Maternal Grandfather    Social History  Substance Use Topics  . Smoking status: Never Smoker  . Smokeless tobacco: Never Used  . Alcohol use No   Current Outpatient Prescriptions  Medication Sig Dispense Refill  . aspirin EC 81 MG tablet Take 81 mg by mouth daily.      Marland Kitchen atenolol (TENORMIN) 25 MG tablet Take 25 mg by mouth daily.    . fluticasone (FLONASE) 50 MCG/ACT nasal spray Place into both nostrils daily.    Marland Kitchen ibuprofen (ADVIL,MOTRIN) 800 MG tablet Take 1 tablet (800 mg total) by  mouth as needed. 90 tablet 1  . metFORMIN (GLUCOPHAGE) 1000 MG tablet Take 1,000 mg by mouth 2 (two) times daily with a meal.    . omeprazole (PRILOSEC) 20 MG capsule Take 20 mg by mouth daily.    . ranitidine (ZANTAC) 150 MG capsule Take 150 mg by mouth as needed.    . rosuvastatin (CRESTOR) 10 MG tablet Take 10 mg by mouth daily.    . simethicone (GAS-X) 80 MG chewable tablet Chew 80 mg by mouth every 6 (six) hours as needed for flatulence.    . valsartan (DIOVAN) 80 MG tablet Take 1 tablet (80 mg total) by mouth daily. 90 tablet 1   No current facility-administered medications for this visit.    Allergies  Allergen Reactions  . Niaspan [Niacin Er] Other (See Comments)    Flushing-sleep disruption     Review of Systems: All systems reviewed and negative except where noted in HPI.    Labs as above  Physical Exam: BP 110/70   Pulse 68   Ht 5\' 3"  (1.6 m)   Wt 151 lb 8 oz (68.7 kg)   BMI 26.84 kg/m  Constitutional: Pleasant,well-developed, female in no acute distress. HEENT: Normocephalic and atraumatic. Conjunctivae are normal. No scleral icterus. Neck supple.  Cardiovascular: Normal rate, regular rhythm.  Pulmonary/chest: Effort normal and breath sounds normal. No wheezing, rales or rhonchi. Abdominal: Soft, nondistended, nontender.  There are no masses palpable. No hepatomegaly. Extremities: no edema Lymphadenopathy: No cervical adenopathy noted. Neurological: Alert and oriented to person place and time. Skin: Skin is warm and dry. No rashes noted. Psychiatric: Normal mood and affect. Behavior is normal.   ASSESSMENT AND PLAN: 66 year old female with a history of diabetes here for new patient evaluation to discuss the following issues:  Epigastric pain / early satiety - routinely using ibuprofen 800 mg per day or greater, concern for possible PUD. H. pylori breath test negative. Gastroparesis also a possibility. Given new onset of symptoms and her age I'm recommending  an upper endoscopy to further evaluate. I discussed the risks and benefits of endoscopy and she wished proceed. In the interim I'm recommending she stop all NSAID use and follow-up with her primary care to discuss other preventative medications to treat headaches. In the interim we'll increase her omeprazole to 40 mg a day. If EGD is negative may consider gastric emptying study or empiric trial of Reglan.  GERD - appears well-controlled on current regimen, increasing omeprazole as above to treat empirically for PUD while endoscopy is pending.  CRC screening - she wishes to avoid colonoscopy if at all possible. She is rescheduled for stool based testing per primary care next week. She understands if this test is  positive she will warrant a colonoscopy, and she is agreeable to proceeding with it in the setting.  If stool test negative, continue yearly FIT test.  Inland Cellar, MD [facility] Pager [phone]  CC: Katherina Mires, MD

## 2016-10-27 NOTE — Patient Instructions (Signed)
If you are age 64 or older, your body mass index should be between 23-30. Your Body mass index is 26.84 kg/m. If this is out of the aforementioned range listed, please consider follow up with your Primary Care Provider.  If you are age 76 or younger, your body mass index should be between 19-25. Your Body mass index is 26.84 kg/m. If this is out of the aformentioned range listed, please consider follow up with your Primary Care Provider.   You have been scheduled for an endoscopy. Please follow written instructions given to you at your visit today. If you use inhalers (even only as needed), please bring them with you on the day of your procedure. Your physician has requested that you go to www.startemmi.com and enter the access code given to you at your visit today. This web site gives a general overview about your procedure. However, you should still follow specific instructions given to you by our office regarding your preparation for the procedure.  Please increase Omeprazole to 20mg  twice daily  Please discontinue NSAIDS.  Please follow up with your PCP about your headaches.  Thank you.

## 2016-11-09 ENCOUNTER — Telehealth: Payer: Self-pay

## 2016-11-09 ENCOUNTER — Other Ambulatory Visit: Payer: Self-pay

## 2016-11-09 ENCOUNTER — Encounter: Payer: Self-pay | Admitting: Gastroenterology

## 2016-11-09 DIAGNOSIS — R195 Other fecal abnormalities: Secondary | ICD-10-CM

## 2016-11-09 MED ORDER — NA SULFATE-K SULFATE-MG SULF 17.5-3.13-1.6 GM/177ML PO SOLN: 1.0000 | ORAL | 0 refills | Status: DC

## 2016-11-09 NOTE — Telephone Encounter (Signed)
Thanks for your help.

## 2016-11-09 NOTE — Telephone Encounter (Signed)
Spoke to patient, was able to work it out that she could have the colonoscopy and EGD on the same day, June 4. I have mailed patient additional prep instructions, Amb. Referral placed for 2nd procedure, prep sent to her pharmacy.

## 2016-11-21 ENCOUNTER — Ambulatory Visit: Payer: BLUE CROSS/BLUE SHIELD | Admitting: Gastroenterology

## 2016-11-21 ENCOUNTER — Emergency Department (HOSPITAL_COMMUNITY): Payer: BLUE CROSS/BLUE SHIELD

## 2016-11-21 ENCOUNTER — Encounter: Payer: BLUE CROSS/BLUE SHIELD | Admitting: Gastroenterology

## 2016-11-21 ENCOUNTER — Emergency Department (HOSPITAL_COMMUNITY)
Admission: EM | Admit: 2016-11-21 | Discharge: 2016-11-21 | Disposition: A | Discharge status: Home / Self Care | Payer: BLUE CROSS/BLUE SHIELD | Attending: Emergency Medicine | Admitting: Emergency Medicine

## 2016-11-21 ENCOUNTER — Encounter (HOSPITAL_COMMUNITY): Payer: Self-pay | Admitting: *Deleted

## 2016-11-21 DIAGNOSIS — R569 Unspecified convulsions: Secondary | ICD-10-CM | POA: Insufficient documentation

## 2016-11-21 DIAGNOSIS — I10 Essential (primary) hypertension: Secondary | ICD-10-CM | POA: Insufficient documentation

## 2016-11-21 DIAGNOSIS — Z79899 Other long term (current) drug therapy: Secondary | ICD-10-CM | POA: Insufficient documentation

## 2016-11-21 DIAGNOSIS — Z7982 Long term (current) use of aspirin: Secondary | ICD-10-CM | POA: Diagnosis not present

## 2016-11-21 DIAGNOSIS — E1151 Type 2 diabetes mellitus with diabetic peripheral angiopathy without gangrene: Secondary | ICD-10-CM | POA: Insufficient documentation

## 2016-11-21 LAB — CBC WITH DIFFERENTIAL/PLATELET
Basophils Absolute: 0 10*3/uL (ref 0.0–0.1)
Basophils Relative: 0 %
Eosinophils Absolute: 0.2 10*3/uL (ref 0.0–0.7)
Eosinophils Relative: 1 %
HCT: 39.3 % (ref 36.0–46.0)
Hemoglobin: 13.5 g/dL (ref 12.0–15.0)
Lymphocytes Relative: 14 %
Lymphs Abs: 2.4 10*3/uL (ref 0.7–4.0)
MCH: 28 pg (ref 26.0–34.0)
MCHC: 34.4 g/dL (ref 30.0–36.0)
MCV: 81.5 fL (ref 78.0–100.0)
Monocytes Absolute: 1.3 10*3/uL — ABNORMAL HIGH (ref 0.1–1.0)
Monocytes Relative: 7 %
Neutro Abs: 13.3 10*3/uL — ABNORMAL HIGH (ref 1.7–7.7)
Neutrophils Relative %: 78 %
Platelets: 312 10*3/uL (ref 150–400)
RBC: 4.82 MIL/uL (ref 3.87–5.11)
RDW: 13.2 % (ref 11.5–15.5)
WBC: 17.1 10*3/uL — ABNORMAL HIGH (ref 4.0–10.5)

## 2016-11-21 LAB — BASIC METABOLIC PANEL
Anion gap: 10 (ref 5–15)
BUN: 9 mg/dL (ref 6–20)
CO2: 26 mmol/L (ref 22–32)
Calcium: 8.8 mg/dL — ABNORMAL LOW (ref 8.9–10.3)
Chloride: 96 mmol/L — ABNORMAL LOW (ref 101–111)
Creatinine, Ser: 0.59 mg/dL (ref 0.44–1.00)
GFR calc Af Amer: >60 mL/min (ref >60)
GFR calc non Af Amer: >60 mL/min (ref >60)
Glucose, Bld: 149 mg/dL — ABNORMAL HIGH (ref 65–99)
Potassium: 3.5 mmol/L (ref 3.5–5.1)
Sodium: 132 mmol/L — ABNORMAL LOW (ref 135–145)

## 2016-11-21 LAB — CBG MONITORING, ED: Glucose-Capillary: 145 mg/dL — ABNORMAL HIGH (ref 65–99)

## 2016-11-21 MED ORDER — LEVETIRACETAM 500 MG PO TABS
500.0000 mg | ORAL_TABLET | Freq: Once | ORAL | Status: AC
  Administered 2016-11-21: 500 mg via ORAL
  Filled 2016-11-21: qty 1

## 2016-11-21 MED ORDER — ONDANSETRON HCL 4 MG/2ML IJ SOLN
4.0000 mg | Freq: Once | INTRAMUSCULAR | Status: AC
  Administered 2016-11-21: 4 mg via INTRAVENOUS
  Filled 2016-11-21: qty 2

## 2016-11-21 MED ORDER — SODIUM CHLORIDE 0.9 % IV BOLUS (SEPSIS)
500.0000 mL | Freq: Once | INTRAVENOUS | Status: AC
  Administered 2016-11-21: 500 mL via INTRAVENOUS

## 2016-11-21 MED ORDER — ACETAMINOPHEN 325 MG PO TABS
650.0000 mg | ORAL_TABLET | Freq: Once | ORAL | Status: AC
  Administered 2016-11-21: 650 mg via ORAL
  Filled 2016-11-21: qty 2

## 2016-11-21 MED ORDER — LEVETIRACETAM 500 MG PO TABS: 500.0000 mg | ORAL_TABLET | Freq: Two times a day (BID) | ORAL | 0 refills | Status: DC

## 2016-11-21 NOTE — ED Notes (Signed)
Pt ambulated to the restroom with no assitance 

## 2016-11-21 NOTE — Progress Notes (Signed)
0716-- Called to [facility] waiting room for pt having a seizure. On arrival pt in chair having seizure, back rigid, arms drawn into center, pt's eyes closed, jaw clenched, shaking. Immediately moved to floor with assistance of other's in the room. 911 called immediately.  Pt dusky in color and frothy at mouth. O2 sat 88%, placed on 2 lpm O2, without improvement, increased O2 to 6 lpm. HR 120's. Pt is a diabetic. CBG 143. Seizure lasted approximately 5-6 minutes. Husband at pt's side and states pt has had seizures in the past, not currently on any seizure medications. Pt placed on side with pillow. No loss of bowel or bladder. Dr. Havery Moros came to waiting room to assist and evaluate. Osvaldo Angst, CRNA and Adin Hector, CRNA also in attendance briefly.  When 911 arrived, pt beginning to arouse. Does not speak. Unable to sit up, pulling at belts on stretche. Transferred to [employer] .

## 2016-11-21 NOTE — Discharge Instructions (Signed)
I discussed your case with the neurologist on-call. They recommend starting you on seizure medication. Recommend follow-up with neurology. Phone number given.

## 2016-11-21 NOTE — ED Notes (Signed)
Seizure pads placed

## 2016-11-21 NOTE — ED Notes (Signed)
Bed: [facility] Expected date:  Expected time:  Means of arrival:  Comments: 

## 2016-11-21 NOTE — ED Notes (Signed)
Patient is alert and oriented x3.  She was given DC instructions and follow up visit instructions.  Patient gave verbal understanding. She was DC ambulatory under her own power to home.  V/S stable.  He was not showing any signs of distress on DC 

## 2016-11-21 NOTE — ED Provider Notes (Signed)
[facility] DEPT Provider Note   CSN: [id number] Arrival date & time: 11/21/16  0755     History   Chief Complaint Chief Complaint  Patient presents with  . Seizures    HPI Victoria Mullen is a 66 y.o. female.  Level V caveat for postictal behavior. Patient was awaiting a endoscopy/colonoscopy this morning when her husband reported generalized flexion of her upper and lower extremities with grimacing and mental status changes for approximate 5-6 minutes. Ultimately the symptoms abated. However she remains sleepy and disoriented. She is status post meningioma resection in 2013. Her husband reports similar symptoms in the past but no seizure has ever been diagnosed. No prodromal illnesses. No residual neurological deficits. No evidence of meningitis.      Past Medical History:  Diagnosis Date  . 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 Dysrhythmia   . Essential hypertension, benign   . Fatty liver disease, nonalcoholic   . GERD (gastroesophageal reflux disease)   . Hypercholesteremia   . Laryngopharyngeal reflux (LPR)   . Melanocytic nevi of trunk    with atypia  . Meningioma [facility]) 12/ 14/12  . Microalbuminuria   . Migraine    w/aura  . Tachycardia, unspecified    only on sleep study  . Type II or unspecified type diabetes mellitus without mention of complication, not stated as uncontrolled    with peripheral neuropathy  . Vitamin D insufficiency     Patient Active Problem List   Diagnosis Date Noted  . Meningioma ([place]) 06/03/2011  . Migraine   . Essential hypertension, benign   . Fatty liver disease, nonalcoholic   . Type II or unspecified type diabetes mellitus without mention of complication, not stated as uncontrolled   . Melanocytic nevi of trunk   . Vitamin D insufficiency   . Microalbuminuria   . Laryngopharyngeal reflux (LPR)     Past Surgical History:  Procedure Laterality Date  . [place] STUDY  03/05/2012   Procedure: [place] STUDY;  Surgeon: Melissa Montane, MD;  Location: [facility];  Service: Endoscopy;  Laterality: N/A;  . BRAIN SURGERY    . BREAST REDUCTION SURGERY  05/29/2012   Procedure: MAMMARY REDUCTION  (BREAST);  Surgeon: Charlene Brooke, MD;  Location: [facility];  Service: Plastics;  Laterality: Bilateral;  . CHOLECYSTECTOMY  1990's  . CRANIOTOMY  06/09/2011   Procedure: CRANIOTOMY TUMOR EXCISION;  Surgeon: Winfield Cunas;  Location: [facility] NEURO ORS;  Service: Neurosurgery;  Laterality: N/A;  Bifrontal Craniotomy for Excision of Tumor  . DILATION AND CURETTAGE OF UTERUS    . ESOPHAGEAL MANOMETRY  03/05/2012   Procedure: ESOPHAGEAL MANOMETRY (EM);  Surgeon: Melissa Montane, MD;  [facility];  Service: Endoscopy;  Laterality: N/A;  Spoke with Dr Janace Hoard nruse and patient about adding the Manometry.   Marland Kitchen UTERINE FIBROID SURGERY  ~ 2009    OB History    No data available       Home Medications    Prior to Admission medications   Medication Sig Start Date End Date Taking? Authorizing Provider  aspirin EC 81 MG tablet Take 81 mg by mouth at bedtime.    Yes [provider]  atenolol (TENORMIN) 25 MG tablet Take 25 mg by mouth daily.   Yes [provider]  fluticasone (FLONASE) 50 MCG/ACT nasal spray Place 1 spray into both nostrils daily.    Yes [provider]  metFORMIN (GLUCOPHAGE) 1000 MG tablet Take 1,000 mg by mouth 2 (two) times daily with a meal.   Yes [provider]  omeprazole (PRILOSEC) 20 MG capsule Take 20 mg by mouth 2 (two) times daily before a meal.    Yes [provider]  ranitidine (ZANTAC) 150 MG tablet Take 150 mg by mouth daily as needed for heartburn.   Yes [provider]  rosuvastatin (CRESTOR) 10 MG tablet Take 10 mg by mouth every evening.    Yes [provider]  Simethicone (GAS-X ULTRA STRENGTH) 180 MG CAPS Take 2 capsules by mouth daily as needed  (for gas).   Yes [provider]  valsartan (DIOVAN) 80 MG tablet Take 1 tablet (80 mg total) by mouth daily. 03/28/12 11/21/16 Yes Barton Fanny, MD  levETIRAcetam (KEPPRA) 500 MG tablet Take 1 tablet (500 mg total) by mouth 2 (two) times daily. 11/21/16   Nat Christen, MD  Na Sulfate-K Sulfate-Mg Sulf 17.5-3.13-1.6 GM/180ML SOLN Take 1 kit by mouth as directed. Patient not taking: Reported on 11/21/2016 11/09/16   Armbruster, Renelda Loma, MD    Family History Family History  Problem Relation Age of Onset  . Cancer Mother   . Diabetes Mother   . Diabetes Father   . Stroke Father   . Hypertension Father   . Diabetes Maternal Grandfather     Social History Social History  Substance Use Topics  . Smoking status: Never Smoker  . Smokeless tobacco: Never Used  . Alcohol use No     Allergies   Chocolate and Niaspan [niacin er]   Review of Systems Review of Systems  All other systems reviewed and are negative.    Physical Exam Updated Vital Signs BP 122/71 (BP Location: Right Arm)   Pulse 75   Temp 98.1 F (36.7 C) (Oral)   Resp 16   SpO2 97%   Physical Exam  Constitutional: She is oriented to person, place, and time. She appears well-developed and well-nourished.  HENT:  Head: Normocephalic and atraumatic.  Eyes: Conjunctivae are normal.  Neck: Neck supple.  Cardiovascular: Normal rate and regular rhythm.   Pulmonary/Chest: Effort normal and breath sounds normal.  Abdominal: Soft. Bowel sounds are normal.  Musculoskeletal: Normal range of motion.  Neurological: She is alert and oriented to person, place, and time.  Skin: Skin is warm and dry.  Psychiatric: She has a normal mood and affect. Her behavior is normal.  Nursing note and vitals reviewed.    ED Treatments / Results  Labs (all labs ordered are listed, but only abnormal results are displayed) Labs Reviewed  CBC WITH DIFFERENTIAL/PLATELET - Abnormal; Notable for the following:       Result  Value   WBC 17.1 (*)    Neutro Abs 13.3 (*)    Monocytes Absolute 1.3 (*)    All other components within normal limits  BASIC METABOLIC PANEL - Abnormal; Notable for the following:    Sodium 132 (*)    Chloride 96 (*)    Glucose, Bld 149 (*)    Calcium 8.8 (*)    All other components within normal limits  CBG MONITORING, ED - Abnormal; Notable for the following:    Glucose-Capillary 145 (*)    All other components within normal limits    EKG  EKG Interpretation None       Radiology Ct Head Wo Contrast  Result Date: 11/21/2016 CLINICAL DATA:  66 year old female with seizure like activity this morning. Prior surgery for  frontal meningioma 2012. EXAM: CT HEAD WITHOUT CONTRAST TECHNIQUE: Contiguous axial images were obtained from the base of the skull through the vertex without intravenous contrast. COMPARISON:  Post resection brain MRI 11/19/2012 and earlier. Immediate postop head CT 06/10/2011. FINDINGS: Brain: Chronic inferior bifrontal encephalomalacia, also chronically affecting frontal horn periventricular white matter on the left. Chronic mildly lobulated anterior cranial fossa (series 2, image 9) where chronic dural thickening and enhancement was stable in 2014. No cerebral edema suspected. No midline shift, ventriculomegaly, mass effect, intracranial hemorrhage or evidence of cortically based acute infarction. Outside the area of inferior frontal encephalomalacia gray-white matter differentiation remains normal. Vascular: Mild Calcified atherosclerosis at the skull base. Calcified atherosclerosis also along the course of the left MCA. No suspicious intracranial vascular hyperdensity. Skull: Previous bifrontal craniotomy. No acute osseous abnormality identified. Sinuses/Orbits: Stable. Postoperative changes to the frontal bones in superior aspect of both frontal sinuses which are chronically opacified. Paranasal sinuses, mastoids, tympanic cavities, and petrous apex air cells are well  pneumatized elsewhere. Other: No acute orbit or scalp soft tissue findings. Postoperative changes to the anterior scalp. IMPRESSION: 1.  No acute intracranial abnormality. 2. 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 Mild nodularity along the anterior cranial fossa where chronic dural thickening was demonstrated on prior MRIs. 3. Chronic calcified atherosclerosis at the skullbase and along the course of the left MCA. Electronically Signed   By: Genevie Ann M.D.   On: 11/21/2016 10:29    Procedures Procedures (including critical care time)  Medications Ordered in ED Medications  sodium chloride 0.9 % bolus 500 mL (0 mLs Intravenous Stopped 11/21/16 1210)  acetaminophen (TYLENOL) tablet 650 mg (650 mg Oral Given 11/21/16 1307)  ondansetron (ZOFRAN) injection 4 mg (4 mg Intravenous Given 11/21/16 1308)  levETIRAcetam (KEPPRA) tablet 500 mg (500 mg Oral Given 11/21/16 1357)     Initial Impression / Assessment and Plan / ED Course  I have reviewed the triage vital signs and the nursing notes.  Pertinent labs & imaging results that were available during my care of the patient were reviewed by me and considered in my medical decision making (see chart for details).     By the time I evaluated the patient, her behavior had returned to normal. She had normal physical exam. CT scan of head showed no acute findings; However, it was noted that she had a bifrontal resection of her tumor. I discussed these findings with the neurologist on-call. He recommended Keppra 500 mg twice a day. Discussed with the patient and her husband. They will follow-up with neurology.  Final Clinical Impressions(s) / ED Diagnoses   Final diagnoses:  Seizure ([place])    New Prescriptions New Prescriptions   LEVETIRACETAM (KEPPRA) 500 MG TABLET    Take 1 tablet (500 mg total) by mouth 2 (two) times daily.     Nat Christen, MD 11/21/16 1414

## 2016-11-21 NOTE — ED Triage Notes (Signed)
Patient is alert and postictal.  She was at [facility] for an appointment this morning when she had a witnessed seizure for approximately 5 minuets.  Patient is postictal on arrival to the ED.  Patient denies any pain.

## 2016-11-22 NOTE — Progress Notes (Signed)
Agree with note as outlined. Upon my arrival the patient had normal oxygen saturations and vital signs, on supplemental oxygen. She was post-ictal when arrived and would not respond to verbal commands, but started awakening as paramedics arrived. Husband reported he had witnessed seizures at their home a few times over the past several months, was taken to the ER near where they live and he states "they didn't believe me" and had not referred her for evaluation for possible seizures. This was not relayed to be at our most recent clinic visit. She was taken to the hospital by EMS. Neurology was consulted and she was started on antiseizure medications. She will need to be stable on this regimen for at least a few months prior to rescheduling her endoscopies. She can follow up with me in clinic in a few months for reassessment.

## 2016-12-28 ENCOUNTER — Telehealth: Payer: Self-pay

## 2016-12-28 NOTE — Telephone Encounter (Signed)
Dr. Havery Moros,  Victoria Mullen is the patient that had a seizure in our waiting room prior to her procedure on 11/21/16. I am preparing her chart for PV on 01/03/17 and she is scheduled for a Colonoscopy and Endoscopy on 01/25/17. In reviewing your note on 11/21/16, I understood that the patient should follow up with you in the clinic to reassess her going forward with the procedures. I noted that some how she is already scheduled. Please let me know if the patient should cancel these two appointments and follow up with you in the clinic. It has not been two months yet. It was my understanding that the [facility] policy was that the patient has to wait 12 months before have their procedure at the [facility]. Please advise. Thanks!  Riki Sheer, LPN

## 2016-12-28 NOTE — Telephone Encounter (Signed)
John can you clarify the [place] policy on seizures - how long does this patient need to be on medication and controlled prior to proceeding with endoscopy at the [facility] - is it 2 or 3 months? Thanks for any clarification

## 2016-12-30 ENCOUNTER — Encounter: Payer: Self-pay | Admitting: Neurology

## 2016-12-30 ENCOUNTER — Ambulatory Visit (INDEPENDENT_AMBULATORY_CARE_PROVIDER_SITE_OTHER): Payer: BLUE CROSS/BLUE SHIELD | Admitting: Neurology

## 2016-12-30 VITALS — BP 126/68 | HR 63 | Ht 63.0 in | Wt 152.0 lb

## 2016-12-30 DIAGNOSIS — G43109 Migraine with aura, not intractable, without status migrainosus: Secondary | ICD-10-CM | POA: Diagnosis not present

## 2016-12-30 DIAGNOSIS — G40309 Generalized idiopathic epilepsy and epileptic syndromes, not intractable, without status epilepticus: Secondary | ICD-10-CM | POA: Diagnosis not present

## 2016-12-30 MED ORDER — TOPIRAMATE ER 100 MG PO CAP24: 100.0000 mg | ORAL_CAPSULE | Freq: Every day | ORAL | 12 refills | Status: DC

## 2016-12-30 NOTE — Progress Notes (Signed)
[facility]    Provider:  Dr Jaynee Eagles Referring Provider: Katherina Mires, MD Primary Care Physician:  Katherina Mires, MD  CC:  seizures  HPI:  Victoria Mullen is a 66 y.o. female here as a referral from Dr. Doreene Nest for seizures. Patient has a past medical history of diabetes, hypertension, hypercholesterolemia, meningioma status post resection in 2013, migraine with aura, multiple episodes of altered awareness, obstructive sleep apnea who presented to the emergency room 11/21/2016. Per review of records, patient was awaiting endoscopy/colonoscopy that morning when her husband reported generalized flexion of her upper and lower extremities with grimacing and mental status changes for 5-6 minutes.  Per nurses notes, on arrival to emergency room patient was in chair having a seizure, back rigid, [facility] into [facility], eyes closed, jaws clenched, shaking, dusky in color and frothy at the mouth. O2 sats were 88% and she was placed onf O2, heart rate in the 120s. Coast was 143. Seizure lasted 5-6 minutes. EMS was calle she was post ictal and she was transferred to [facility] emergency room.She remained sleepy and disoriented. This is happened in the past but no diagnosis of seizures. Reviewed labs which include white blood cells of 17.1 and sodium 132, glucose 149, otherwise unremarkable. She was given Keppra 500 mg twice daily. Previously patient had been in the emergency room in February 2018 for an apneic episode overnight where husband awakened at 3 AM to his wife's snoring loudly but she stopped breathing which lasted 20 minutes with inability to wake patient, she was also confused in the ambulance and returned back to baseline, she has had several sleep studies in the past that were negative. It appears as though patient has been evaluated by neurology in 2017 with normal MRI, EEG, carotid ultrasound, sleep study and neuro follow-up July 2017 and she was offered antiepileptic drugs and  patient declined.   Patient is here alone. She stopped taking Keppra. She does not like all the side effects. In the past for surgery she has been on Keppra maybe and she felt "dull and thick" and emotional. She has had seizures mostly at night and she attributes it to getting choked and stop breathing. She has migraines as well. No medication overuse. She has headaches every day. The headaches feel like pressure through the forehead or pain on the left side, unilteral, feels like a pulling, she gets light and sound sensitivity, she smells things that are not there. No aura but she can have change sin vision. No nausea or vomiting, rarely nausea. She has thrown up in the past. Mother has a PMHx of migraines.   Reviewed notes, labs and imaging from outside physicians, which showed:  EEG awake and asleep 01/04/2016 was normal. MRI of the brain 09/29/2015 showed postsurgical changes from meningioma resection left frontal region with no apparent residual tumor.  Per review of records, patient was awaiting endoscopy/colonoscopy that morning when her husband reported generalized flexion of her upper and lower extremities with grimacing and mental status changes for 5-6 minutes.  Per nurses notes, on arrival to emergency room patient was in chair having a seizure, back rigid, [facility] into [facility], eyes closed, jaws clenched, shaking, dusky in color and frothy at the mouth. O2 sats were 88% and she was placed onf O2, heart rate in the 120s. Coast was 143. Seizure lasted 5-6 minutes. EMS was calle she was post ictal and she was transferred to [facility] emergency room.She remained sleepy and disoriented. This is happened  in the past but no diagnosis of seizures. Reviewed labs which include white blood cells of 17.1 and sodium 132, glucose 149, otherwise unremarkable. She was given Keppra 500 mg twice daily. Previously patient had been in the emergency room in February 2018 for an apneic episode overnight where  husband awakened at 3 AM to his wife's snoring loudly but she stopped breathing which lasted 20 minutes with inability to wake patient, she was also confused in the ambulance and returned back to baseline, she has had several sleep studies in the past that were negative. She was referred back to pulmonology.    Review of Systems: Patient complains of symptoms per HPI as well as the following symptoms: snoring, headache, weakness, seizure. Pertinent negatives and positives per HPI. All others negative.   Social History   Social History  . Marital status: Married    Spouse name: N/A  . Number of children: 0  . Years of education: BA   Occupational History  . House wife    Social History Main Topics  . Smoking status: Never Smoker  . Smokeless tobacco: Never Used  . Alcohol use No  . Drug use: No  . Sexual activity: Yes    Partners: Male     Comment: married   Other Topics Concern  . Not on file   Social History Narrative   Lives at home w/ her husband   Right-handed   Caffeine: 2 cups coffee per day    Family History  Problem Relation Age of Onset  . Cancer Mother   . Diabetes Mother   . Diabetes Father   . Stroke Father   . Hypertension Father   . Diabetes Maternal Grandfather   . Seizures Neg Hx     Past Medical History:  Diagnosis Date  . 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 Dysrhythmia   . Essential hypertension, benign   . Fatty liver disease, nonalcoholic   . GERD (gastroesophageal reflux disease)   . Hypercholesteremia   . Laryngopharyngeal reflux (LPR)   . Melanocytic nevi of trunk    with atypia  . Meningioma [facility]) 12/ 14/12  . Microalbuminuria   . Migraine    w/aura  . Tachycardia, unspecified    only on sleep study  . Type II or unspecified type diabetes mellitus without mention of complication, not stated as uncontrolled    with peripheral neuropathy  . Vitamin D  insufficiency     Past Surgical History:  Procedure Laterality Date  . [place] STUDY  03/05/2012   Procedure: [place] STUDY;  Surgeon: Melissa Montane, MD;  [facility];  Service: Endoscopy;  Laterality: N/A;  . BRAIN SURGERY    . BREAST REDUCTION SURGERY  05/29/2012   Procedure: MAMMARY REDUCTION  (BREAST);  Surgeon: Charlene Brooke, MD;  Location: [facility];  Service: Plastics;  Laterality: Bilateral;  . CHOLECYSTECTOMY  1990's  . CRANIOTOMY  06/09/2011   Procedure: CRANIOTOMY TUMOR EXCISION;  Surgeon: Winf[place] Cunas;  Location: [facility] NEURO ORS;  Service: Neurosurgery;  Laterality: N/A;  Bifrontal Craniotomy for Excision of Tumor  . DILATION AND CURETTAGE OF UTERUS    . ESOPHAGEAL MANOMETRY  03/05/2012   Procedure: ESOPHAGEAL MANOMETRY (EM);  Surgeon: Melissa Montane, MD;  [facility];  Service: Endoscopy;  Laterality: N/A;  Spoke with Dr Janace Hoard nruse and patient about adding the Manometry.   Marland Kitchen UTERINE FIBROID SURGERY  ~  2009    Current Outpatient Prescriptions  Medication Sig Dispense Refill  . aspirin EC 81 MG tablet Take 81 mg by mouth at bedtime.     Marland Kitchen atenolol (TENORMIN) 25 MG tablet Take 25 mg by mouth daily.    . fluticasone (FLONASE) 50 MCG/ACT nasal spray Place 1 spray into both nostrils daily.     Marland Kitchen ibuprofen (ADVIL,MOTRIN) 800 MG tablet Take 800 mg by mouth every 8 (eight) hours as needed.    . metFORMIN (GLUCOPHAGE) 1000 MG tablet Take 1,000 mg by mouth 2 (two) times daily with a meal.    . omeprazole (PRILOSEC) 20 MG capsule Take 20 mg by mouth 2 (two) times daily before a meal.     . ranitidine (ZANTAC) 150 MG tablet Take 150 mg by mouth daily as needed for heartburn.    . rosuvastatin (CRESTOR) 10 MG tablet Take 10 mg by mouth every evening.     . Simethicone (GAS-X ULTRA STRENGTH) 180 MG CAPS Take 2 capsules by mouth daily as needed (for gas).    . Topiramate ER 100 MG CP24 Take 100 mg by mouth at bedtime. 30 capsule 12  . valsartan  (DIOVAN) 80 MG tablet Take 1 tablet (80 mg total) by mouth daily. 90 tablet 1   No current facility-administered medications for this visit.     Allergies as of 12/30/2016 - Review Complete 12/30/2016  Allergen Reaction Noted  . Chocolate Other (See Comments) 11/21/2016  . Niaspan [niacin er] Other (See Comments) 04/12/2011    Vitals: BP 126/68   Pulse 63   Ht 5\' 3"  (1.6 m)   Wt 152 lb (68.9 kg)   BMI 26.93 kg/m  Last Weight:  Wt Readings from Last 1 Encounters:  12/30/16 152 lb (68.9 kg)   Last Height:   Ht Readings from Last 1 Encounters:  12/30/16 5\' 3"  (1.6 m)    Physical exam: Exam: Gen: NAD, conversant, well nourised, well groomed                     CV: RRR, no MRG. No Carotid Bruits. No peripheral edema, warm, nontender Eyes: Conjunctivae clear without exudates or hemorrhage  Neuro: Detailed Neurologic Exam  Speech:    Speech is normal; fluent and spontaneous with normal comprehension.  Cognition:    The patient is oriented to person, place, and time;     recent and remote memory intact;     language fluent;     normal attention, concentration,     fund of knowledge Cranial Nerves:    The pupils are equal, round, and reactive to light. The fundi are normal and spontaneous venous pulsations are present. Visual fields are full to finger confrontation. Extraocular movements are intact. Trigeminal sensation is intact and the muscles of mastication are normal. The face is symmetric. The palate elevates in the midline. Hearing intact. Voice is normal. Shoulder shrug is normal. The tongue has normal motion without fasciculations.   Coordination:    Normal finger to nose and heel to shin. Normal rapid alternating movements.   Gait:    Heel-toe and tandem gait are normal.   Motor Observation:    No asymmetry, no atrophy, and no involuntary movements noted. Tone:    Normal muscle tone.    Posture:    Posture is normal. normal erect    Strength:    Strength  is V/V in the upper and lower limbs.      Sensation: intact to LT  Reflex Exam:  DTR's:    Deep tendon reflexes in the upper and lower extremities are normal bilaterally.   Toes:    The toes are downgoing bilaterally.   Clonus:    Clonus is absent.      Assessment/Plan:  66 year old patient with a witnessed GTCS while awaiting endoscopy/colonoscopy. She has hx of seizures and is not treated. She stopped her Keppra. Since she also has migraines will treat with topiramate. Discussed seizure precautions as well as [place] driving laws.  As far as your medications are concerned, I would like to suggest:  Topiramate (Qudexy) : Week one 25mg  Week two 50mg  Week three and beyond:100mg   I would like to see you back in 4 months, sooner if we need to. Please call us with any interim questions, concerns, problems, updates or refill requests.   Patient is unable to drive, operate heavy machinery, perform activities at heights or participate in water activities until 6 months seizure free  Discussed Patients with epilepsy have a small risk of sudden unexpected death, a condition referred to as sudden unexpected death in epilepsy (SUDEP). SUDEP is defined specifically as the sudden, unexpected, witnessed or unwitnessed, nontraumatic and nondrowning death in patients with epilepsy with or without evidence for a seizure, and excluding documented status epilepticus, in which post mortem examination does not reveal a structural or toxicologic cause for death   Cc: Dr. Mayra Neer, [facility] [address] [place], [place] [address]  Phone [phone] Fax [phone]

## 2016-12-30 NOTE — Patient Instructions (Addendum)
Remember to drink plenty of fluid, eat healthy meals and do not skip any meals. Try to eat protein with a every meal and eat a healthy snack such as fruit or nuts in between meals. Try to keep a regular sleep-wake schedule and try to exercise daily, particularly in the form of walking, 20-30 minutes a day, if you can.   As far as your medications are concerned, I would like to suggest:  Topiramate (Qudexy) : Week one 25mg  Week two 50mg  Week three and beyond:100mg   I would like to see you back in 4 months, sooner if we need to. Please call us with any interim questions, concerns, problems, updates or refill requests.   Our phone number is [phone]. We also have an after hours call service for urgent matters and there is a physician on-call for urgent questions. For any emergencies you know to call 911 or go to the nearest emergency room  Topiramate extended-release capsules What is this medicine? TOPIRAMATE (toe PYRE a mate) is used to treat seizures in adults or children with epilepsy. It is also used for the prevention of migraine headaches. This medicine may be used for other purposes; ask your health care provider or pharmacist if you have questions. COMMON BRAND NAME(S): Trokendi XR What should I tell my health care provider before I take this medicine? They need to know if you have any of these conditions: -cirrhosis of the liver or liver disease -diarrhea -glaucoma -kidney stones or kidney disease -lung disease like asthma, obstructive pulmonary disease, emphysema -metabolic acidosis -on a ketogenic diet -scheduled for surgery or a procedure -suicidal thoughts, plans, or attempt; a previous suicide attempt by you or a family member -an unusual or allergic reaction to topiramate, other medicines, foods, dyes, or preservatives -pregnant or trying to get pregnant -breast-feeding How should I use this medicine? Take this medicine by mouth with a glass of water. Follow the  directions on the prescription label. Trokendi XR capsules must be swallowed whole. Do not sprinkle on food, break, crush, dissolve, or chew. Qudexy XR capsules may be swallowed whole or opened and sprinkled on a small amount of soft food. This mixture must be swallowed immediately. Do not chew or store mixture for later use. You may take this medicine with meals. Take your medicine at regular intervals. Do not take it more often than directed. Talk to your pediatrician regarding the use of this medicine in children. Special care may be needed. While Trokendi XR may be prescribed for children as young as 6 years and Qudexy XR may be prescribed for children as young as 2 years for selected conditions, precautions do apply. Overdosage: If you think you have taken too much of this medicine contact a poison control center or emergency room at once. NOTE: This medicine is only for you. Do not share this medicine with others. What if I miss a dose? If you miss a dose, take it as soon as you can. If it is almost time for your next dose, take only that dose. Do not take double or extra doses. What may interact with this medicine? Do not take this medicine with any of the following medications: -probenecid This medicine may also interact with the following medications: -acetazolamide -alcohol -amitriptyline -birth control pills -digoxin -hydrochlorothiazide -lithium -medicines for pain, sleep, or muscle relaxation -metformin -methazolamide -other seizure or epilepsy medicines -pioglitazone -risperidone This list may not describe all possible interactions. Give your health care provider a list of all the medicines,  herbs, non-prescription drugs, or dietary supplements you use. Also tell them if you smoke, drink alcohol, or use illegal drugs. Some items may interact with your medicine. What should I watch for while using this medicine? Visit your doctor or health care professional for regular checks on  your progress. Do not stop taking this medicine suddenly. This increases the risk of seizures if you are using this medicine to control epilepsy. Wear a medical identification bracelet or chain to say you have epilepsy or seizures, and carry a card that lists all your medicines. This medicine can decrease sweating and increase your body temperature. Watch for signs of deceased sweating or fever, especially in children. Avoid extreme heat, hot baths, and saunas. Be careful about exercising, especially in hot weather. Contact your health care provider right away if you notice a fever or decrease in sweating. You should drink plenty of fluids while taking this medicine. If you have had kidney stones in the past, this will help to reduce your chances of forming kidney stones. If you have stomach pain, with nausea or vomiting and yellowing of your eyes or skin, call your doctor immediately. You may get drowsy, dizzy, or have blurred vision. Do not drive, use machinery, or do anything that needs mental alertness until you know how this medicine affects you. To reduce dizziness, do not sit or stand up quickly, especially if you are an older patient. Alcohol can increase drowsiness and dizziness. Avoid alcoholic drinks. Do not drink alcohol for 6 hours before or 6 hours after taking Trokendi XR. If you notice blurred vision, eye pain, or other eye problems, seek medical attention at once for an eye exam. The use of this medicine may increase the chance of suicidal thoughts or actions. Pay special attention to how you are responding while on this medicine. Any worsening of mood, or thoughts of suicide or dying should be reported to your health care professional right away. This medicine may increase the chance of developing metabolic acidosis. If left untreated, this can cause kidney stones, bone disease, or slowed growth in children. Symptoms include breathing fast, fatigue, loss of appetite, irregular heartbeat, or  loss of consciousness. Call your doctor immediately if you experience any of these side effects. Also, tell your doctor about any surgery you plan on having while taking this medicine since this may increase your risk for metabolic acidosis. Birth control pills may not work properly while you are taking this medicine. Talk to your doctor about using an extra method of birth control. Women who become pregnant while using this medicine may enroll in the [place] Pregnancy Registry by calling [phone]. This registry collects information about the safety of antiepileptic drug use during pregnancy. What side effects may I notice from receiving this medicine? Side effects that you should report to your doctor or health care professional as soon as possible: -allergic reactions like skin rash, itching or hives, swelling of the face, lips, or tongue -decreased sweating and/or rise in body temperature -depression -difficulty breathing, fast or irregular breathing patterns -difficulty speaking -difficulty walking or controlling muscle movements -hearing impairment -redness, blistering, peeling or loosening of the skin, including inside the mouth -tingling, pain or numbness in the hands or feet -unusually weak or tired -worsening of mood, thoughts or actions of suicide or dying Side effects that usually do not require medical attention (report to your doctor or health care professional if they continue or are bothersome): -altered taste -back pain, joint or muscle  aches and pains -diarrhea, or constipation -headache -loss of appetite -nausea -stomach upset, indigestion -tremors This list may not describe all possible side effects. Call your doctor for medical advice about side effects. You may report side effects to FDA at 1-800-FDA-1088. Where should I keep my medicine? Keep out of the reach of children. Store at room temperature between 15 and 30 degrees C (59 and 86  degrees F) in a tightly closed container. Protect from moisture. Throw away any unused medicine after the expiration date. NOTE: This sheet is a summary. It may not cover all possible information. If you have questions about this medicine, talk to your doctor, pharmacist, or health care provider.  2018 Elsevier/Gold Standard (2015-09-25 12:33:11)   Epilepsy Epilepsy is a condition in which a person has repeated seizures over time. A seizure is a sudden burst of abnormal electrical and chemical activity in the brain. Seizures can cause a change in attention, behavior, or the ability to remain awake and alert (altered mental status). Epilepsy increases a person's risk of falls, accidents, and injury. It can also lead to complications, including:  Depression.  Poor memory.  Sudden unexplained death in epilepsy (SUDEP). This complication is rare, and its cause is not known.  Most people with epilepsy lead normal lives. What are the causes? This condition may be caused by:  A head injury.  An injury that happens at birth.  A high fever during childhood.  A stroke.  Bleeding that goes into or around the brain.  Certain medicines and drugs.  Having too little oxygen for a long period of time.  Abnormal brain development.  Certain infections, such as meningitis and encephalitis.  Brain tumors.  Conditions that are passed along from parent to child (are hereditary).  What are the signs or symptoms? Symptoms of a seizure vary greatly from person to person. They include:  Convulsions.  Stiffening of the body.  Involuntary movements of the arms or legs.  Loss of consciousness.  Breathing problems.  Falling suddenly.  Confusion.  Head nodding.  Eye blinking or fluttering.  Lip smacking.  Drooling.  Rapid eye movements.  Grunting.  Loss of bladder control and bowel control.  Staring.  Unresponsiveness.  Some people have symptoms right before a seizure  happens (aura) and right after a seizure happens. Symptoms of an aura include:  Fear or anxiety.  Nausea.  Feeling like the room is spinning (vertigo).  A feeling of having seen or heard something before (deja vu).  Odd tastes or smells.  Changes in vision, such as seeing flashing lights or spots.  Symptoms that follow a seizure include:  Confusion.  Sleepiness.  Headache.  How is this diagnosed? This condition is diagnosed based on:  Your symptoms.  Your medical history.  A physical exam.  A neurological exam. A neurological exam is similar to a physical exam. It involves checking your strength, reflexes, coordination, and sensations.  Tests, such as: ? An electroencephalogram (EEG). This is a painless test that creates a diagram of your brain waves. ? An MRI of the brain. ? A CT scan of the brain. ? A lumbar puncture, also called a spinal tap. ? Blood tests to check for signs of infection or abnormal blood chemistry.  How is this treated? There is no cure for this condition, but treatment can help control seizures. Treatment may involve:  Taking medicines to control seizures. These include medicines to prevent seizures and medicines to stop seizures as they occur.  Having  a device called a vagus nerve stimulator implanted in the chest. The device sends electrical impulses to the vagus nerve and to the brain to prevent seizures. This treatment may be recommended if medicines do not help.  Brain surgery. There are several kinds of surgeries that may be done to stop seizures from happening or to reduce how often seizures happen.  Having regular blood tests. You may need to have blood tests regularly to check that you are getting the right amount of medicine.  Once this condition has been diagnosed, it is important to begin treatment as soon as possible. For some people, epilepsy eventually goes away. Follow these instructions at home: Medicines   Take  over-the-counter and prescription medicines only as told by your health care provider.  Avoid any substances that may prevent your medicine from working properly, such as alcohol. Activity  Get enough rest. Lack of sleep can make seizures more likely to occur.  Follow instructions from your health care provider about driving, swimming, and doing any other activities that would be dangerous if you had a seizure. Educating others Teach friends and family what to do if you have a seizure. They should:  Lay you on the ground to prevent a fall.  Cushion your head and body.  Loosen any tight clothing around your neck.  Turn you on your side. If vomiting occurs, this helps keep your airway clear.  Stay with you until you recover.  Not hold you down. Holding you down will not stop the seizure.  Not put anything in your mouth.  Know whether or not you need emergency care.  General instructions  Avoid anything that has ever triggered a seizure for you.  Keep a seizure diary. Record what you remember about each seizure, especially anything that might have triggered the seizure.  Keep all follow-up visits as told by your health care provider. This is important. Contact a health care provider if:  Your seizure pattern changes.  You have symptoms of infection or another illness. This might increase your risk of having a seizure. Get help right away if:  You have a seizure that does not stop after 5 minutes.  You have several seizures in a row without a complete recovery in between seizures.  You have a seizure that makes it harder to breathe.  You have a seizure that is different from previous seizures.  You have a seizure that leaves you unable to speak or use a part of your body.  You did not wake up immediately after a seizure. This information is not intended to replace advice given to you by your health care provider. Make sure you discuss any questions you have with your  health care provider. Document Released: 06/06/2005 Document Revised: 01/02/2016 Document Reviewed: 12/15/2015 Elsevier Interactive Patient Education  [employer].

## 2016-12-31 DIAGNOSIS — G40909 Epilepsy, unspecified, not intractable, without status epilepticus: Secondary | ICD-10-CM | POA: Insufficient documentation

## 2017-01-03 ENCOUNTER — Ambulatory Visit (AMBULATORY_SURGERY_CENTER): Payer: Self-pay

## 2017-01-03 VITALS — Ht 63.0 in | Wt 151.0 lb

## 2017-01-03 DIAGNOSIS — K921 Melena: Secondary | ICD-10-CM

## 2017-01-03 DIAGNOSIS — K219 Gastro-esophageal reflux disease without esophagitis: Secondary | ICD-10-CM

## 2017-01-03 MED ORDER — NA SULFATE-K SULFATE-MG SULF 17.5-3.13-1.6 GM/177ML PO SOLN: ORAL | 0 refills | Status: DC

## 2017-01-03 NOTE — Telephone Encounter (Signed)
Here is Victoria Mullen's response from yesterday:   Doc,   This pt had a seizure in the waiting area a few weeks ago just prior to her scheduled procedure. She was recently seen by a neurologist and has been started on topriamate. She will be interviewed in [facility] tomorrow and is schedule for endo/colon on 01/25/17. I spoke with her this afternoon and she is compliant with her anti-seizure med. On the date of the procedure she will have been on it for almost a month. So, if she remains seizure free, she is cleared for anesthetic care at [facility].   With kind regards,   Oleh Genin apparently spoke with her and she said she is compliant with the medication. If she is not compliant, I don't think we can proceed if she is not taking her medication. If Jenny Reichmann is in the office today at [facility] I would speak with him directly if possible. Thanks

## 2017-01-03 NOTE — Progress Notes (Signed)
Per pt, no allergies to soy or egg products.Pt not taking any weight loss meds or using  O2 at home.  Pt refused Emmi video.  Pt came into the office today for her pre-visit prior to her endo/colon on 01/25/17. Pt has been compliant with her seizure medication-Topiramate 25 mg and is increasing it weekly as directed. No seizure activity since 11/21/16! Reviewed prep and instructions with pt and answered all her questions. She will call back with any questions or problems.

## 2017-01-03 NOTE — Telephone Encounter (Signed)
Thanks for clarifying. Glad we can proceed as scheduled

## 2017-01-03 NOTE — Telephone Encounter (Signed)
Dr Havery Moros, Just letting you know the pt came into the office today for her pre-visit prior to her endo/ colon on 01/25/17. The pt has not had any recent seizures since the last one on 11/21/16. She has been compliant with her Topiramate 25 mg which she is taking 25mg  daily until 01/06/17 and then will increase each week until she takes 100 mg daily. Reviewed instructions with pt and answered her questions. No problems at the pre-visit.Thanks,Mykal Kirchman

## 2017-01-03 NOTE — Telephone Encounter (Signed)
Dr. Havery Moros,  Victoria Mullen is scheduled for PV today at 1:30 Pm. She is scheduled for a colonoscopy and an endoscopy on 01/25/17. According to her neurologist she is non-compliant with taking medication for seizures because she doesn't like the way it makes her feel. Victoria Mullen spoke with Victoria Mullen yesterday and he said that he would review the patients chart and get back to me regarding her having the procedures and if they should be done in the [facility]. I haven't heard back from John, and the patient is coming in today.  I would hate for her to come in if she isn't going to have these procedures. Please advise. Thanks.  Riki Sheer, LPN ( PV )

## 2017-01-11 ENCOUNTER — Encounter: Payer: Self-pay | Admitting: Neurology

## 2017-01-16 ENCOUNTER — Encounter: Payer: BLUE CROSS/BLUE SHIELD | Admitting: Gastroenterology

## 2017-01-16 ENCOUNTER — Encounter: Payer: Self-pay | Admitting: Gastroenterology

## 2017-01-25 ENCOUNTER — Ambulatory Visit (AMBULATORY_SURGERY_CENTER): Payer: BLUE CROSS/BLUE SHIELD | Admitting: Gastroenterology

## 2017-01-25 ENCOUNTER — Encounter: Payer: Self-pay | Admitting: Gastroenterology

## 2017-01-25 VITALS — BP 129/81 | HR 63 | Temp 96.6°F | Resp 21 | Ht 63.0 in | Wt 151.0 lb

## 2017-01-25 DIAGNOSIS — R195 Other fecal abnormalities: Secondary | ICD-10-CM | POA: Diagnosis not present

## 2017-01-25 DIAGNOSIS — D126 Benign neoplasm of colon, unspecified: Secondary | ICD-10-CM | POA: Diagnosis not present

## 2017-01-25 DIAGNOSIS — D123 Benign neoplasm of transverse colon: Secondary | ICD-10-CM

## 2017-01-25 DIAGNOSIS — K219 Gastro-esophageal reflux disease without esophagitis: Secondary | ICD-10-CM

## 2017-01-25 DIAGNOSIS — D120 Benign neoplasm of cecum: Secondary | ICD-10-CM

## 2017-01-25 DIAGNOSIS — K6389 Other specified diseases of intestine: Secondary | ICD-10-CM | POA: Diagnosis not present

## 2017-01-25 DIAGNOSIS — D125 Benign neoplasm of sigmoid colon: Secondary | ICD-10-CM

## 2017-01-25 DIAGNOSIS — D122 Benign neoplasm of ascending colon: Secondary | ICD-10-CM

## 2017-01-25 DIAGNOSIS — K635 Polyp of colon: Secondary | ICD-10-CM | POA: Diagnosis not present

## 2017-01-25 DIAGNOSIS — K921 Melena: Secondary | ICD-10-CM

## 2017-01-25 HISTORY — PX: UPPER GASTROINTESTINAL ENDOSCOPY: SHX188

## 2017-01-25 HISTORY — PX: COLONOSCOPY: SHX174

## 2017-01-25 MED ORDER — SODIUM CHLORIDE 0.9 % IV SOLN: 500.0000 mL | INTRAVENOUS | Status: DC

## 2017-01-25 NOTE — Progress Notes (Signed)
Pt's states no medical or surgical changes since previsit or office visit. 

## 2017-01-25 NOTE — Op Note (Signed)
[facility] Patient Name: Victoria Mullen Procedure Date: 01/25/2017 10:11 AM MRN: [id number] Endoscopist: Remo Lipps P. Jhordan Kinter MD, MD Age: 66 Referring MD:  Date of Birth: 1951-05-23 Gender: Female Account #: [id number] Procedure:                Colonoscopy Indications:              This is the patient's first colonoscopy, Positive                            fecal immunochemical test Medicines:                Monitored Anesthesia Care Procedure:                Pre-Anesthesia Assessment:                           - Prior to the procedure, a History and Physical                            was performed, and patient medications and                            allergies were reviewed. The patient's tolerance of                            previous anesthesia was also reviewed. The risks                            and benefits of the procedure and the sedation                            options and risks were discussed with the patient.                            All questions were answered, and informed consent                            was obtained. Prior Anticoagulants: The patient has                            taken no previous anticoagulant or antiplatelet                            agents. ASA Grade Assessment: II - A patient with                            mild systemic disease. After reviewing the risks                            and benefits, the patient was deemed in                            satisfactory condition to undergo the procedure.  After obtaining informed consent, the colonoscope                            was passed under direct vision. Throughout the                            procedure, the patient's blood pressure, pulse, and                            oxygen saturations were monitored continuously. The                            Colonoscope was introduced through the anus and                            advanced to the the cecum,  identified by                            appendiceal orifice and ileocecal valve. The                            colonoscopy was performed without difficulty. The                            patient tolerated the procedure well. The quality                            of the bowel preparation was good. The ileocecal                            valve, appendiceal orifice, and rectum were                            photographed. Scope In: 10:22:50 AM Scope Out: 10:44:08 AM Scope Withdrawal Time: 0 hours 17 minutes 12 seconds  Total Procedure Duration: 0 hours 21 minutes 18 seconds  Findings:                 The perianal and digital rectal examinations were                            normal.                           A 3 mm polyp was found in the cecum. The polyp was                            sessile. The polyp was removed with a cold snare.                            Resection and retrieval were complete.                           A 4 mm polyp was found in the ascending colon. The  polyp was sessile. The polyp was removed with a                            cold snare. Resection and retrieval were complete.                           A 4 mm polyp was found in the transverse colon. The                            polyp was sessile. The polyp was removed with a                            cold snare. Resection and retrieval were complete.                           A 5 mm polyp was found in the sigmoid colon. The                            polyp was sessile. The polyp was removed with a                            cold snare. Resection and retrieval were complete.                           A 15 mm polyp was found in the sigmoid colon. The                            polyp was pedunculated. The polyp was removed with                            a hot snare. Resection and retrieval were complete.                           Internal hemorrhoids were found during                             retroflexion. The hemorrhoids were small.                           The exam was otherwise without abnormality. Complications:            No immediate complications. Estimated blood loss:                            Minimal. Estimated Blood Loss:     Estimated blood loss was minimal. Impression:               - One 3 mm polyp in the cecum, removed with a cold                            snare. Resected and retrieved.                           -  One 4 mm polyp in the ascending colon, removed                            with a cold snare. Resected and retrieved.                           - One 4 mm polyp in the transverse colon, removed                            with a cold snare. Resected and retrieved.                           - One 5 mm polyp in the sigmoid colon, removed with                            a cold snare. Resected and retrieved.                           - One 15 mm polyp in the sigmoid colon, removed                            with a hot snare. Resected and retrieved.                           - Internal hemorrhoids.                           - The examination was otherwise normal. Recommendation:           - Patient has a contact number available for                            emergencies. The signs and symptoms of potential                            delayed complications were discussed with the                            patient. Return to normal activities tomorrow.                            Written discharge instructions were provided to the                            patient.                           - Resume previous diet.                           - Continue present medications.                           - Await pathology results.                           -  Repeat colonoscopy is recommended for                            surveillance. The colonoscopy date will be                            determined after pathology results from today's                             exam become available for review.                           - No ibuprofen, naproxen, or other non-steroidal                            anti-inflammatory drugs for 2 weeks after polyp                            removal. Remo Lipps P. Khalaya Mcgurn MD, MD 01/25/2017 10:48:42 AM This report has been signed electronically.

## 2017-01-25 NOTE — Progress Notes (Signed)
Called to room to assist during endoscopic procedure.  Patient ID and intended procedure confirmed with present staff. Received instructions for my participation in the procedure from the performing physician.  

## 2017-01-25 NOTE — Patient Instructions (Signed)
YOU HAD AN ENDOSCOPIC PROCEDURE TODAY AT [facility]:   Refer to the procedure report that was given to you for any specific questions about what was found during the examination.  If the procedure report does not answer your questions, please call your gastroenterologist to clarify.  If you requested that your care partner not be given the details of your procedure findings, then the procedure report has been included in a sealed envelope for you to review at your convenience later.  YOU SHOULD EXPECT: Some feelings of bloating in the abdomen. Passage of more gas than usual.  Walking can help get rid of the air that was put into your GI tract during the procedure and reduce the bloating. If you had a lower endoscopy (such as a colonoscopy or flexible sigmoidoscopy) you may notice spotting of blood in your stool or on the toilet paper. If you underwent a bowel prep for your procedure, you may not have a normal bowel movement for a few days.  Please Note:  You might notice some irritation and congestion in your nose or some drainage.  This is from the oxygen used during your procedure.  There is no need for concern and it should clear up in a day or so.  SYMPTOMS TO REPORT IMMEDIATELY:   Following lower endoscopy (colonoscopy or flexible sigmoidoscopy):  Excessive amounts of blood in the stool  Significant tenderness or worsening of abdominal pains  Swelling of the abdomen that is new, acute  Fever of 100F or higher   Following upper endoscopy (EGD)  Vomiting of blood or coffee ground material  New chest pain or pain under the shoulder blades  Painful or persistently difficult swallowing  New shortness of breath  Fever of 100F or higher  Black, tarry-looking stools  For urgent or emergent issues, a gastroenterologist can be reached at any hour by calling [phone].   DIET:  We do recommend a small meal at first, but then you may proceed to your regular diet.  Drink  plenty of fluids but you should avoid alcoholic beverages for 24 hours.  ACTIVITY:  You should plan to take it easy for the rest of today and you should NOT DRIVE or use heavy machinery until tomorrow (because of the sedation medicines used during the test).    FOLLOW UP: Our staff will call the number listed on your records the next business day following your procedure to check on you and address any questions or concerns that you may have regarding the information given to you following your procedure. If we do not reach you, we will leave a message.  However, if you are feeling well and you are not experiencing any problems, there is no need to return our call.  We will assume that you have returned to your regular daily activities without incident.  If any biopsies were taken you will be contacted by phone or by letter within the next 1-3 weeks.  Please call us at [phone] if you have not heard about the biopsies in 3 weeks.    SIGNATURES/CONFIDENTIALITY: You and/or your care partner have signed paperwork which will be entered into your electronic medical record.  These signatures attest to the fact that that the information above on your After Visit Summary has been reviewed and is understood.  Full responsibility of the confidentiality of this discharge information lies with you and/or your care-partner   Avoid non steroidal anti-inflammatories if possible. Do positively avoid for  the next 2 weeks.  Polyp and hemorrhoid informtion given.

## 2017-01-25 NOTE — Progress Notes (Signed)
Report to PACU, RN, vss, BBS= Clear.  

## 2017-01-25 NOTE — Op Note (Signed)
[facility] Patient Name: Victoria Mullen Procedure Date: 01/25/2017 10:12 AM MRN: [id number] Endoscopist: Remo Lipps P. Kynzlee Hucker MD, MD Age: 66 Referring MD:  Date of Birth: 02-27-1951 Gender: Female Account #: [id number] Procedure:                Upper GI endoscopy Indications:              Epigastric abdominal pain, history of NSAID use,                            improved on omeprazole Medicines:                Monitored Anesthesia Care Procedure:                Pre-Anesthesia Assessment:                           - Prior to the procedure, a History and Physical                            was performed, and patient medications and                            allergies were reviewed. The patient's tolerance of                            previous anesthesia was also reviewed. The risks                            and benefits of the procedure and the sedation                            options and risks were discussed with the patient.                            All questions were answered, and informed consent                            was obtained. Prior Anticoagulants: The patient has                            taken no previous anticoagulant or antiplatelet                            agents. ASA Grade Assessment: II - A patient with                            mild systemic disease. After reviewing the risks                            and benefits, the patient was deemed in                            satisfactory condition to undergo the procedure.  After obtaining informed consent, the endoscope was                            passed under direct vision. Throughout the                            procedure, the patient's blood pressure, pulse, and                            oxygen saturations were monitored continuously. The                            Endoscope was introduced through the mouth, and                            advanced to the second part  of duodenum. The upper                            GI endoscopy was accomplished without difficulty.                            The patient tolerated the procedure well. Scope In: Scope Out: Findings:                 Esophagogastric landmarks were identified: the                            Z-line was found at 35 cm, the gastroesophageal                            junction was found at 35 cm and the upper extent of                            the gastric folds was found at 35 cm from the                            incisors.                           The exam of the esophagus was otherwise normal.                           The entire examined stomach was normal. Biopsies                            were taken from the incisura, antrum, and body with                            a cold forceps for Helicobacter pylori testing.                           The duodenal bulb and second portion of the  duodenum were normal. Complications:            No immediate complications. Estimated blood loss:                            Minimal. Estimated Blood Loss:     Estimated blood loss was minimal. Impression:               - Esophagogastric landmarks identified.                           - Normal esophagus otherwise.                           - Normal stomach. Biopsied to rule out H pylori.                           - Normal duodenal bulb and second portion of the                            duodenum. Recommendation:           - Patient has a contact number available for                            emergencies. The signs and symptoms of potential                            delayed complications were discussed with the                            patient. Return to normal activities tomorrow.                            Written discharge instructions were provided to the                            patient.                           - Resume previous diet.                           -  Continue present medications, including omeprazole                           - Avoid NSAIDs if possible                           - Await pathology results. Remo Lipps P. Eudell Julian MD, MD 01/25/2017 10:51:59 AM This report has been signed electronically.

## 2017-01-26 ENCOUNTER — Telehealth: Payer: Self-pay | Admitting: *Deleted

## 2017-01-26 NOTE — Telephone Encounter (Signed)
  Follow up Call-  Call back number 01/25/2017  Post procedure Call Back phone  # [phone]  Permission to leave phone message Yes  Some recent data might be hidden     Patient questions:  Do you have a fever, pain , or abdominal swelling? No. Pain Score  0 *  Have you tolerated food without any problems? Yes.    Have you been able to return to your normal activities? Yes.    Do you have any questions about your discharge instructions: Diet   No. Medications  No. Follow up visit  No.  Do you have questions or concerns about your Care? No.  Actions: * If pain score is 4 or above: No action needed, pain <4.

## 2017-02-01 ENCOUNTER — Encounter: Payer: Self-pay | Admitting: Gastroenterology

## 2017-02-16 ENCOUNTER — Encounter: Payer: Self-pay | Admitting: Neurology

## 2017-02-16 ENCOUNTER — Encounter: Payer: Self-pay | Admitting: Gastroenterology

## 2017-02-19 ENCOUNTER — Other Ambulatory Visit: Payer: Self-pay | Admitting: Neurology

## 2017-02-19 DIAGNOSIS — G43109 Migraine with aura, not intractable, without status migrainosus: Secondary | ICD-10-CM

## 2017-02-19 MED ORDER — TOPIRAMATE ER 100 MG PO CAP24
100.0000 mg | ORAL_CAPSULE | Freq: Every day | ORAL | 4 refills | Status: AC
Start: 2017-02-19 — End: ?

## 2017-02-22 ENCOUNTER — Telehealth: Payer: Self-pay | Admitting: Neurology

## 2017-02-22 NOTE — Telephone Encounter (Signed)
Patient is calling to get samples of Topiramate ER (TROKENDI XR) 100 MG CP24. [facility] is taking longer to get Rx.

## 2017-02-23 NOTE — Telephone Encounter (Signed)
Rn call patient about needing samples. PT stated it will take a week or two for them to process the medication and mail it out. Rn notified Dr. Jaynee Eagles and she will have samples for patient at the front desk until her mail order comes in. Pt verbalized understanding.

## 2017-03-02 ENCOUNTER — Ambulatory Visit (INDEPENDENT_AMBULATORY_CARE_PROVIDER_SITE_OTHER): Payer: BLUE CROSS/BLUE SHIELD | Admitting: Adult Health

## 2017-03-02 ENCOUNTER — Encounter: Payer: Self-pay | Admitting: Adult Health

## 2017-03-02 VITALS — BP 114/66 | HR 70 | Ht 63.0 in | Wt 150.0 lb

## 2017-03-02 DIAGNOSIS — R569 Unspecified convulsions: Secondary | ICD-10-CM

## 2017-03-02 DIAGNOSIS — G43009 Migraine without aura, not intractable, without status migrainosus: Secondary | ICD-10-CM | POA: Diagnosis not present

## 2017-03-02 NOTE — Progress Notes (Signed)
PATIENT: Victoria Mullen DOB: [birth year]  REASON FOR VISIT: follow up HISTORY FROM: patient  HISTORY OF PRESENT ILLNESS: Today 03/02/17 Victoria Mullen is a 66 year old female with a history of seizures. She returns today for follow-up. She states that since she started on topirmate She is not had any seizure events. She reports that she is tolerating medication well however she has noted that the price of the medication has increased. Although she also states that she has not tried to use her co-pay card. The patient also states that she has a history of migraine headaches. Reports that she had meningioma resection in 2013. She states after that her migraine headaches improved. She states currently she is only having 1 migraine a month. Typically occurs on the left side starting in the neck and radiating to the frontal region. She does have photophobia, phonophobia as well as nausea. She states in the past she used to have a visual aura but she no longer has this. She states occasionally if she is driving and the sunlight come through the trees creating a strobe-like effect this will cause a migraine. She states typically if she takes 800 mg of ibuprofen as soon as the headache starts this will alleviate her migraine. She returns today for an evaluation.  HISTORY 12/30/16: Victoria Mullen is a 66 y.o. female here as a referral from Dr. Doreene Nest for seizures. Patient has a past medical history of diabetes, hypertension, hypercholesterolemia, meningioma status post resection in 2013, migraine with aura, multiple episodes of altered awareness, obstructive sleep apnea who presented to the emergency room 11/21/2016. Per review of records, patient was awaiting endoscopy/colonoscopy that morning when her husband reported generalized flexion of her upper and lower extremities with grimacing and mental status changes for 5-6 minutes.  Per nurses notes, on arrival to emergency room patient was in chair having a  seizure, back rigid, [facility] into [facility], eyes closed, jaws clenched, shaking, dusky in color and frothy at the mouth. O2 sats were 88% and she was placed onf O2, heart rate in the 120s. Coast was 143. Seizure lasted 5-6 minutes. EMS was calle she was post ictal and she was transferred to [facility] emergency room.She remained sleepy and disoriented. This is happened in the past but no diagnosis of seizures. Reviewed labs which include white blood cells of 17.1 and sodium 132, glucose 149, otherwise unremarkable. She was given Keppra 500 mg twice daily. Previously patient had been in the emergency room in February 2018 for an apneic episode overnight where husband awakened at 3 AM to his wife's snoring loudly but she stopped breathing which lasted 20 minutes with inability to wake patient, she was also confused in the ambulance and returned back to baseline, she has had several sleep studies in the past that were negative. It appears as though patient has been evaluated by neurology in 2017 with normal MRI, EEG, carotid ultrasound, sleep study and neuro follow-up July 2017 and she was offered antiepileptic drugs and patient declined.   Patient is here alone. She stopped taking Keppra. She does not like all the side effects. In the past for surgery she has been on Keppra maybe and she felt "dull and thick" and emotional. She has had seizures mostly at night and she attributes it to getting choked and stop breathing. She has migraines as well. No medication overuse. She has headaches every day. The headaches feel like pressure through the forehead or pain on the left side, unilteral, feels like  a pulling, she gets light and sound sensitivity, she smells things that are not there. No aura but she can have change sin vision. No nausea or vomiting, rarely nausea. She has thrown up in the past. Mother has a PMHx of migraines.   Reviewed notes, labs and imaging from outside physicians, which showed:  EEG awake  and asleep 01/04/2016 was normal. MRI of the brain 09/29/2015 showed postsurgical changes from meningioma resection left frontal region with no apparent residual tumor.  Per review of records, patient was awaiting endoscopy/colonoscopy that morning when her husband reported generalized flexion of her upper and lower extremities with grimacing and mental status changes for 5-6 minutes.  Per nurses notes, on arrival to emergency room patient was in chair having a seizure, back rigid, [facility] into [facility], eyes closed, jaws clenched, shaking, dusky in color and frothy at the mouth. O2 sats were 88% and she was placed onf O2, heart rate in the 120s. Coast was 143. Seizure lasted 5-6 minutes. EMS was calle she was post ictal and she was transferred to [facility] emergency room.She remained sleepy and disoriented. This is happened in the past but no diagnosis of seizures. Reviewed labs which include white blood cells of 17.1 and sodium 132, glucose 149, otherwise unremarkable. She was given Keppra 500 mg twice daily. Previously patient had been in the emergency room in February 2018 for an apneic episode overnight where husband awakened at 3 AM to his wife's snoring loudly but she stopped breathing which lasted 20 minutes with inability to wake patient, she was also confused in the ambulance and returned back to baseline, she has had several sleep studies in the past that were negative. She was referred back to pulmonology.    REVIEW OF SYSTEMS: Out of a complete 14 system review of symptoms, the patient complains only of the following symptoms, and all other reviewed systems are negative.  See history of present illness  ALLERGIES: Allergies  Allergen Reactions  . Chocolate Other (See Comments)    Headache's   . Niaspan [Niacin Er] Other (See Comments)    Flushing/Burning all over-sleep disruption    HOME MEDICATIONS: Outpatient Medications Prior to Visit  Medication Sig Dispense Refill  .  aspirin EC 81 MG tablet Take 81 mg by mouth at bedtime.     Marland Kitchen atenolol (TENORMIN) 25 MG tablet Take 25 mg by mouth daily.    . metFORMIN (GLUCOPHAGE) 1000 MG tablet Take 1,000 mg by mouth 2 (two) times daily with a meal.    . omeprazole (PRILOSEC) 20 MG capsule Take 20 mg by mouth 2 (two) times daily before a meal.     . ranitidine (ZANTAC) 150 MG tablet Take 150 mg by mouth daily as needed for heartburn.    . rosuvastatin (CRESTOR) 10 MG tablet Take 10 mg by mouth every evening.     . Simethicone (GAS-X ULTRA STRENGTH) 180 MG CAPS Take 2 capsules by mouth daily as needed (for gas).    . Topiramate ER (TROKENDI XR) 100 MG CP24 Take 100 mg by mouth at bedtime. 90 capsule 4  . valsartan (DIOVAN) 80 MG tablet Take 1 tablet (80 mg total) by mouth daily. 90 tablet 1  . fluticasone (FLONASE) 50 MCG/ACT nasal spray Place 1 spray into both nostrils daily.     Marland Kitchen ibuprofen (ADVIL,MOTRIN) 800 MG tablet Take 800 mg by mouth every 8 (eight) hours as needed.     Facility-Administered Medications Prior to Visit  Medication Dose Route Frequency  Provider Last Rate Last Dose  . 0.9 %  sodium chloride infusion  500 mL Intravenous Continuous Armbruster, Carlota Raspberry, MD        PAST MEDICAL HISTORY: Past Medical History:  Diagnosis Date  . 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 Dysrhythmia   . Essential hypertension, benign   . Fatty liver disease, nonalcoholic   . GERD (gastroesophageal reflux disease)   . Hypercholesteremia   . Laryngopharyngeal reflux (LPR)   . Melanocytic nevi of trunk    with atypia  . Meningioma [facility]) 12/ 14/12   Brain surg/frontal area  . Microalbuminuria   . Migraine    w/aura  . Seizures ([place])    last seizure 11/21/16  . Tachycardia, unspecified    only on sleep study  . Type II or unspecified type diabetes mellitus without mention of complication, not stated as uncontrolled    with peripheral neuropathy  .  Vitamin D insufficiency     PAST SURGICAL HISTORY: Past Surgical History:  Procedure Laterality Date  . [place] STUDY  03/05/2012   Procedure: [place] STUDY;  Surgeon: Melissa Montane, MD;  Location: [facility];  Service: Endoscopy;  Laterality: N/A;  . BRAIN SURGERY  2012   frontal lobe  . BREAST REDUCTION SURGERY  05/29/2012   Procedure: MAMMARY REDUCTION  (BREAST);  Surgeon: Charlene Brooke, MD;  Location: [facility];  Service: Plastics;  Laterality: Bilateral;  . CHOLECYSTECTOMY  1990's  . CRANIOTOMY  06/09/2011   Procedure: CRANIOTOMY TUMOR EXCISION;  Surgeon: Winfield Cunas;  Location: [facility];  Service: Neurosurgery;  Laterality: N/A;  Bifrontal Craniotomy for Excision of Tumor  . DILATION AND CURETTAGE OF UTERUS    . ESOPHAGEAL MANOMETRY  03/05/2012   Procedure: ESOPHAGEAL MANOMETRY (EM);  Surgeon: Melissa Montane, MD;  Location: [facility];  Service: Endoscopy;  Laterality: N/A;  Spoke with Dr Janace Hoard nruse and patient about adding the Manometry.   Marland Kitchen UTERINE FIBROID SURGERY  ~ 2009    FAMILY HISTORY: Family History  Problem Relation Age of Onset  . Cancer Mother   . Diabetes Mother   . Squamous cell carcinoma Mother   . Diabetes Father   . Stroke Father   . Hypertension Father   . Diabetes Maternal Grandfather   . Diabetes Maternal Aunt   . Dementia Maternal Aunt   . Seizures Neg Hx     SOCIAL HISTORY: Social History   Social History  . Marital status: Married    Spouse name: N/A  . Number of children: 0  . Years of education: BA   Occupational History  . House wife    Social History Main Topics  . Smoking status: Never Smoker  . Smokeless tobacco: Never Used  . Alcohol use No  . Drug use: No  . Sexual activity: Yes    Partners: Male     Comment: married   Other Topics Concern  . Not on file   Social History Narrative   Lives at home w/ her husband   Right-handed   Caffeine: 2 cups coffee per day      PHYSICAL  EXAM  Vitals:   03/02/17 0716  BP: 114/66  Pulse: 70  Weight: 150 lb (68 kg)  Height: 5\' 3"  (1.6 m)   Body mass index is 26.57 kg/m.  Generalized: Well developed, in no acute distress   Neurological examination  Mentation: Alert oriented to  time, place, history taking. Follows all commands speech and language fluent Cranial nerve II-XII: Pupils were equal round reactive to light. Extraocular movements were full, visual field were full on confrontational test. Facial sensation and strength were normal. Uvula tongue midline. Head turning and shoulder shrug  were normal and symmetric. Motor: The motor testing reveals 5 over 5 strength of all 4 extremities. Good symmetric motor tone is noted throughout.  Sensory: Sensory testing is intact to soft touch on all 4 extremities. No evidence of extinction is noted.  Coordination: Cerebellar testing reveals good finger-nose-finger and heel-to-shin bilaterally.  Gait and station: Gait is normal. Tandem gait is normal. Romberg is negative. No drift is seen.  Reflexes: Deep tendon reflexes are symmetric and normal bilaterally.   DIAGNOSTIC DATA (LABS, IMAGING, TESTING) - I reviewed patient records, labs, notes, testing and imaging myself where available.  Lab Results  Component Value Date   WBC 17.1 (H) 11/21/2016   HGB 13.5 11/21/2016   HCT 39.3 11/21/2016   MCV 81.5 11/21/2016   PLT 312 11/21/2016      Component Value Date/Time   NA 132 (L) 11/21/2016 1039   K 3.5 11/21/2016 1039   CL 96 (L) 11/21/2016 1039   CO2 26 11/21/2016 1039   GLUCOSE 149 (H) 11/21/2016 1039   BUN 9 11/21/2016 1039   CREATININE 0.59 11/21/2016 1039   CREATININE 0.68 03/28/2012 0957   CALCIUM 8.8 (L) 11/21/2016 1039   PROT 7.1 09/28/2011 1237   ALBUMIN 4.2 09/28/2011 1237   AST 16 09/28/2011 1237   ALT 16 03/28/2012 0957   ALKPHOS 57 09/28/2011 1237   BILITOT 0.5 09/28/2011 1237   GFRNONAA >60 11/21/2016 1039   GFRAA >60 11/21/2016 1039   Lab Results   Component Value Date   CHOL 228 (H) 03/28/2012   HDL 38 (L) 03/28/2012   LDLCALC 127 (H) 03/28/2012   TRIG 316 (H) 03/28/2012   CHOLHDL 6.0 03/28/2012   Lab Results  Component Value Date   HGBA1C 5.8 03/28/2012    Lab Results  Component Value Date   TSH 1.994 09/28/2011      ASSESSMENT AND PLAN 66 y.o. 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 here with:  1. Seizures 2. Migraine headache  The patient has not had any seizure events since she started topiramate. She will continue this. I have advised patient that she should not operate a motor vehicle, operate heavy machinery, participate in water activities or perform activities at heights until she is seizure-free for 6 months. The patient's migraine headaches is relatively controlled at this time. I did advise the patient that topiramate can also help with her migraine frequency. For now she gets a migraine she should take ibuprofen 800 mg. However I did advise that if her headaches become more frequent she should let us know. I did caution the patient that overuse of ibuprofen can cause rebound headaches. She voiced understanding. She will follow-up in 6 months with Dr. Jaynee Eagles.     Ward Givens, MSN, NP-C 03/02/2017, 7:32 AM [facility] [address] [place], [place] [address] [phone]

## 2017-03-02 NOTE — Patient Instructions (Signed)
Your Plan:  Continue topirmate- use copay card If migraine frequency increases please let us know You should not drive, operate heavy machinery, perform activities at heights or participate in water activities until 6 months seizure free  Thank you for coming to see [place] at [facility]. I hope we have been able to provide you high quality care today.  You may receive a patient satisfaction survey over the next few weeks. We would appreciate your feedback and comments so that we may continue to improve ourselves and the health of our patients.

## 2017-04-19 NOTE — Progress Notes (Signed)
Personally  participated in, made any corrections needed, and agree with history, physical, neuro exam,assessment and plan as stated above.    Trystin Terhune, MD [facility] 

## 2017-05-02 ENCOUNTER — Ambulatory Visit: Payer: BLUE CROSS/BLUE SHIELD | Admitting: Neurology

## 2017-06-11 ENCOUNTER — Encounter: Payer: Self-pay | Admitting: Neurology

## 2017-06-28 ENCOUNTER — Encounter: Payer: Self-pay | Admitting: Neurology

## 2017-06-29 ENCOUNTER — Telehealth: Payer: Self-pay | Admitting: *Deleted

## 2017-06-29 NOTE — Telephone Encounter (Signed)
Called and spoke with patient. Per Dr. Jaynee Eagles, I scheduled the patient for f/u and discussed PT with the patient. She agreed to PT for her neck pain and I scheduled a f/u with Dr. Jaynee Eagles on 08/15/17 @ 09:30 arrival time 09:00. I canceled her other f/u appt for March since we are seeing her earlier. She verbalized agreement and Dr. Jaynee Eagles was also made aware.

## 2017-07-03 ENCOUNTER — Encounter: Payer: Self-pay | Admitting: *Deleted

## 2017-07-03 ENCOUNTER — Other Ambulatory Visit: Payer: Self-pay | Admitting: Neurology

## 2017-07-03 DIAGNOSIS — M542 Cervicalgia: Secondary | ICD-10-CM

## 2017-07-03 NOTE — Telephone Encounter (Signed)
Email sent to patient to let her know order was placed.

## 2017-07-03 NOTE — Telephone Encounter (Signed)
Looks like we spoke with her on 1/10 and she agreed to PT I will order it htnaks

## 2017-07-03 NOTE — Telephone Encounter (Signed)
Pt called she has been waiting for someone to call for PT. She is aware it does not look like an order was put in. Please call to advise on this at [phone]

## 2017-08-15 ENCOUNTER — Encounter: Payer: Self-pay | Admitting: Neurology

## 2017-08-15 ENCOUNTER — Ambulatory Visit (INDEPENDENT_AMBULATORY_CARE_PROVIDER_SITE_OTHER): Payer: BLUE CROSS/BLUE SHIELD | Admitting: Neurology

## 2017-08-15 VITALS — BP 114/61 | HR 78 | Ht 63.0 in | Wt 144.8 lb

## 2017-08-15 DIAGNOSIS — G43009 Migraine without aura, not intractable, without status migrainosus: Secondary | ICD-10-CM

## 2017-08-15 DIAGNOSIS — R569 Unspecified convulsions: Secondary | ICD-10-CM | POA: Diagnosis not present

## 2017-08-15 NOTE — Progress Notes (Signed)
[facility]    Provider:  Dr Jaynee Eagles Referring Provider: Katherina Mires, MD Primary Care Physician:  Katherina Mires, MD  CC:  Seizures  Interval history 08/15/2017: Patient is here for follow up of seizure-like events and migraines on Topiramate. She is here for follow up.  She is here with her husband. Discussed migraines . Discussed medication management. Her seizures have improved per husband who provides much information. Her migraines were reported as only one a month at last appointment and now they say it has been worsening. She wants to be free of any medications. She has been tested for sleep apnea in the past and negative. She would like a more thorough evaluation, an epileptologist or epilepsy center where they have the ability to take her off of the Topiramate and monitor her on eeg. Patient does not feel there is a good doctor-patient relationship and prefers to go elsewhere.  HPI:  Victoria Mullen is a 67 y.o. female here as a referral from Dr. Doreene Nest for seizures. Patient has a past medical history of diabetes, hypertension, hypercholesterolemia, meningioma status post resection in 2013, migraine with aura, multiple episodes of altered awareness, obstructive sleep apnea who presented to the emergency room 11/21/2016. Per review of records, patient was awaiting endoscopy/colonoscopy that morning when her husband reported generalized flexion of her upper and lower extremities with grimacing and mental status changes for 5-6 minutes.  Per nurses notes, on arrival to emergency room patient was in chair having a seizure, back rigid, [facility] into [facility], eyes closed, jaws clenched, shaking, dusky in color and frothy at the mouth. O2 sats were 88% and she was placed onf O2, heart rate in the 120s. Coast was 143. Seizure lasted 5-6 minutes. EMS was calle she was post ictal and she was transferred to [facility] emergency room.She remained sleepy and disoriented. This is  happened in the past but no diagnosis of seizures. Reviewed labs which include white blood cells of 17.1 and sodium 132, glucose 149, otherwise unremarkable. She was given Keppra 500 mg twice daily. Previously patient had been in the emergency room in February 2018 for an apneic episode overnight where husband awakened at 3 AM to his wife's snoring loudly but she stopped breathing which lasted 20 minutes with inability to wake patient, she was also confused in the ambulance and returned back to baseline, she has had several sleep studies in the past that were negative. It appears as though patient has been evaluated by neurology in 2017 with normal MRI, EEG, carotid ultrasound, sleep study and neuro follow-up July 2017 and she was offered antiepileptic drugs and patient declined.   Patient is here alone. She stopped taking Keppra. She does not like all the side effects. In the past for surgery she has been on Keppra maybe and she felt "dull and thick" and emotional. She has had seizures mostly at night and she attributes it to getting choked and stop breathing. She has migraines as well. No medication overuse. She has headaches every day. The headaches feel like pressure through the forehead or pain on the left side, unilteral, feels like a pulling, she gets light and sound sensitivity, she smells things that are not there. No aura but she can have change sin vision. No nausea or vomiting, rarely nausea. She has thrown up in the past. Mother has a PMHx of migraines.   Reviewed notes, labs and imaging from outside physicians, which showed:  EEG awake and asleep 01/04/2016 was  normal. MRI of the brain 09/29/2015 showed postsurgical changes from meningioma resection left frontal region with no apparent residual tumor.  Per review of records, patient was awaiting endoscopy/colonoscopy that morning when her husband reported generalized flexion of her upper and lower extremities with grimacing and mental status  changes for 5-6 minutes.  Per nurses notes, on arrival to emergency room patient was in chair having a seizure, back rigid, [facility], eyes closed, jaws clenched, shaking, dusky in color and frothy at the mouth. O2 sats were 88% and she was placed onf O2, heart rate in the 120s. Coast was 143. Seizure lasted 5-6 minutes. EMS was calle she was post ictal and she was transferred to [facility] emergency room.She remained sleepy and disoriented. This is happened in the past but no diagnosis of seizures. Reviewed labs which include white blood cells of 17.1 and sodium 132, glucose 149, otherwise unremarkable. She was given Keppra 500 mg twice daily. Previously patient had been in the emergency room in February 2018 for an apneic episode overnight where husband awakened at 3 AM to his wife's snoring loudly but she stopped breathing which lasted 20 minutes with inability to wake patient, she was also confused in the ambulance and returned back to baseline, she has had several sleep studies in the past that were negative. She was referred back to pulmonology.    Review of Systems: Patient complains of symptoms per HPI as well as the following symptoms: snoring, headache, weakness, seizure. Pertinent negatives and positives per HPI. All others negative.   Social History   Socioeconomic History  . Marital status: Married    Spouse name: Not on file  . Number of children: 0  . Years of education: BA  . Highest education level: Not on file  Social Needs  . Financial resource strain: Not on file  . Food insecurity - worry: Not on file  . Food insecurity - inability: Not on file  . Transportation needs - medical: Not on file  . Transportation needs - non-medical: Not on file  Occupational History  . Occupation: House wife  Tobacco Use  . Smoking status: Never Smoker  . Smokeless tobacco: Never Used  Substance and Sexual Activity  . Alcohol use: No  . Drug use: No  . Sexual activity: Yes      Partners: Male    Comment: married  Other Topics Concern  . Not on file  Social History Narrative   Lives at home w/ her husband   Right-handed   Caffeine: multiple cups coffee per day    Family History  Problem Relation Age of Onset  . Cancer Mother   . Diabetes Mother   . Squamous cell carcinoma Mother   . Diabetes Father   . Stroke Father   . Hypertension Father   . Diabetes Maternal Grandfather   . Diabetes Maternal Aunt   . Dementia Maternal Aunt   . Seizures Neg Hx     Past Medical History:  Diagnosis Date  . Diabetes mellitus due to underlying condition with diabetic peripheral angiopathy with gangrene ([place])   . Dysrhythmia   . Essential hypertension, benign   . Fatty liver disease, nonalcoholic   . GERD (gastroesophageal reflux disease)   . Hypercholesteremia   . Laryngopharyngeal reflux (LPR)   . Melanocytic nevi of trunk    with atypia  . Meningioma [facility]) 12/ 14/12   Brain surg/frontal area  . Microalbuminuria   . Migraine    w/aura  .  Seizures ([place])    last seizure 11/21/16  . Tachycardia, unspecified    only on sleep study  . Type II or unspecified type diabetes mellitus without mention of complication, not stated as uncontrolled    with peripheral neuropathy  . Vitamin D insufficiency     Past Surgical History:  Procedure Laterality Date  . [place] STUDY  03/05/2012   Procedure: [place] STUDY;  Surgeon: Melissa Montane, MD;  Location: [facility];  Service: Endoscopy;  Laterality: N/A;  . BRAIN SURGERY  2012   frontal lobe  . BREAST REDUCTION SURGERY  05/29/2012   Procedure: MAMMARY REDUCTION  (BREAST);  Surgeon: Charlene Brooke, MD;  Location: [place];  Service: Plastics;  Laterality: Bilateral;  . CHOLECYSTECTOMY  1990's  . CRANIOTOMY  06/09/2011   Procedure: CRANIOTOMY TUMOR EXCISION;  Surgeon: Winfield Cunas;  Location: [place] [facility];  Service: Neurosurgery;  Laterality: N/A;  Bifrontal Craniotomy for Excision of  Tumor  . DILATION AND CURETTAGE OF UTERUS    . ESOPHAGEAL MANOMETRY  03/05/2012   Procedure: ESOPHAGEAL MANOMETRY (EM);  Surgeon: Melissa Montane, MD;  Location: [facility];  Service: Endoscopy;  Laterality: N/A;  Spoke with Dr Janace Hoard nruse and patient about adding the Manometry.   Marland Kitchen UTERINE FIBROID SURGERY  ~ 2009    Current Outpatient Medications  Medication Sig Dispense Refill  . aspirin EC 81 MG tablet Take 81 mg by mouth at bedtime.     Marland Kitchen atenolol (TENORMIN) 25 MG tablet Take 25 mg by mouth daily.    Marland Kitchen ibuprofen (ADVIL,MOTRIN) 200 MG tablet Take 200-800 mg by mouth as needed for headache.    . losartan (COZAAR) 25 MG tablet Take 25 mg by mouth daily.    . metFORMIN (GLUCOPHAGE) 1000 MG tablet Take 1,000 mg by mouth 2 (two) times daily with a meal.    . omeprazole (PRILOSEC) 20 MG capsule Take 20 mg by mouth 2 (two) times daily before a meal.     . ranitidine (ZANTAC) 150 MG tablet Take 150 mg by mouth daily as needed for heartburn.    . rosuvastatin (CRESTOR) 10 MG tablet Take 10 mg by mouth every evening.     . Simethicone (GAS-X ULTRA STRENGTH) 180 MG CAPS Take 2 capsules by mouth daily as needed (for gas).    . Topiramate ER (TROKENDI XR) 100 MG CP24 Take 100 mg by mouth at bedtime. 90 capsule 4   Current Facility-Administered Medications  Medication Dose Route Frequency Provider Last Rate Last Dose  . 0.9 %  sodium chloride infusion  500 mL Intravenous Continuous Armbruster, Carlota Raspberry, MD        Allergies as of 08/15/2017 - Review Complete 08/15/2017  Allergen Reaction Noted  . Chocolate Other (See Comments) 11/21/2016  . Niaspan [niacin er] Other (See Comments) 04/12/2011    Vitals: BP 114/61 (BP Location: Right Arm, Patient Position: Sitting)   Pulse 78   Ht 5\' 3"  (1.6 m)   Wt 144 lb 12.8 oz (65.7 kg)   BMI 25.65 kg/m  Last Weight:  Wt Readings from Last 1 Encounters:  08/15/17 144 lb 12.8 oz (65.7 kg)   Last Height:   Ht Readings from Last 1 Encounters:  08/15/17 5'  3" (1.6 m)    Physical exam: Exam: Gen: NAD, conversant           Eyes: Conjunctivae clear without exudates or hemorrhage  Neuro: Detailed Neurologic Exam  Speech:  Speech is normal; fluent and spontaneous with normal comprehension.  Cognition:    The patient is oriented  Cranial Nerves:    The face is symmetric. Hearing intact to voice. Voice is normal.   Coordination:    No dysmetria noted  Gait:    Not ataxic  Motor Observation:    No asymmetry, no atrophy, and no involuntary movements noted.  Posture:    Posture is normal. normal erect     Assessment/Plan:  67 year old patient with a witnessed GTCS while awaiting endoscopy/colonoscopy. She has hx of seizures and is not treated. She stopped her Keppra. Since she also has migraines will treat with topiramate. Discussed seizure precautions as well as [place] driving laws.  As far as your medications are concerned, I would like to suggest:  Continue Topiramate Patient would like to be referred to an epilepsy center, will refer to Ernest Haber for evaluation and treatment.  Patient is unable to drive, operate heavy machinery, perform activities at heights or participate in water activities until 6 months seizure free  Discussed Patients with epilepsy have a small risk of sudden unexpected death, a condition referred to as sudden unexpected death in epilepsy (SUDEP). SUDEP is defined specifically as the sudden, unexpected, witnessed or unwitnessed, nontraumatic and nondrowning death in patients with epilepsy with or without evidence for a seizure, and excluding documented status epilepticus, in which post mortem examination does not reveal a structural or toxicologic cause for death   Patient does not feel there is a good doctor-patient relationship and prefers to go elsewhere. Patient is welcome to come back to [place] but should see a different provider in the future.    Cc: Dr. Mayra Neer, MD  [facility] [address] [place], [place] [address]  A total of 40 minutes was spent in with this patient face-to-face and her husband. Over half this time was spent on counseling patient on the diagnosis and different therapeutic options available.    Phone [phone] Fax [phone]

## 2017-08-15 NOTE — Patient Instructions (Signed)
Referral to Dr. Ernest Haber

## 2017-08-30 ENCOUNTER — Ambulatory Visit: Payer: BLUE CROSS/BLUE SHIELD | Admitting: Neurology

## 2020-03-31 ENCOUNTER — Encounter: Payer: Self-pay | Admitting: Gastroenterology

## 2020-06-03 ENCOUNTER — Ambulatory Visit (AMBULATORY_SURGERY_CENTER): Payer: Self-pay | Admitting: *Deleted

## 2020-06-03 ENCOUNTER — Other Ambulatory Visit: Payer: Self-pay

## 2020-06-03 VITALS — Ht 63.0 in | Wt 134.0 lb

## 2020-06-03 DIAGNOSIS — Z86010 Personal history of colonic polyps: Secondary | ICD-10-CM

## 2020-06-03 MED ORDER — NA SULFATE-K SULFATE-MG SULF 17.5-3.13-1.6 GM/177ML PO SOLN: ORAL | 0 refills | Status: DC

## 2020-06-03 NOTE — Progress Notes (Signed)
Patient is here in-person for PV. Patient denies any allergies to eggs or soy. Patient denies any problems with anesthesia/sedation. Patient denies any oxygen use at home. Patient denies taking any diet/weight loss medications or blood thinners. Patient is not being treated for MRSA or C-diff. Patient is aware of our care-partner policy and OHCSP-19 safety protocol.   COVID-19 vaccines completed on 04/25/20 booster, per patient.

## 2020-06-05 ENCOUNTER — Encounter: Payer: Self-pay | Admitting: Gastroenterology

## 2020-06-17 ENCOUNTER — Other Ambulatory Visit: Payer: Self-pay

## 2020-06-17 ENCOUNTER — Ambulatory Visit (AMBULATORY_SURGERY_CENTER): Payer: BC Managed Care – PPO | Admitting: Gastroenterology

## 2020-06-17 ENCOUNTER — Encounter: Payer: Self-pay | Admitting: Gastroenterology

## 2020-06-17 VITALS — BP 117/60 | HR 52 | Temp 97.5°F | Resp 17 | Ht 63.0 in | Wt 134.0 lb

## 2020-06-17 DIAGNOSIS — D120 Benign neoplasm of cecum: Secondary | ICD-10-CM

## 2020-06-17 DIAGNOSIS — D122 Benign neoplasm of ascending colon: Secondary | ICD-10-CM

## 2020-06-17 DIAGNOSIS — Z86010 Personal history of colonic polyps: Secondary | ICD-10-CM

## 2020-06-17 DIAGNOSIS — D123 Benign neoplasm of transverse colon: Secondary | ICD-10-CM

## 2020-06-17 MED ORDER — SODIUM CHLORIDE 0.9 % IV SOLN: 500.0000 mL | Freq: Once | INTRAVENOUS | Status: DC

## 2020-06-17 NOTE — Progress Notes (Signed)
PT taken to PACU. Monitors in place. VSS. Report given to RN. 

## 2020-06-17 NOTE — Progress Notes (Signed)
Called to room to assist during endoscopic procedure.  Patient ID and intended procedure confirmed with present staff. Received instructions for my participation in the procedure from the performing physician.  

## 2020-06-17 NOTE — Progress Notes (Signed)
Pt's states no medical or surgical changes since previsit or office visit. 

## 2020-06-17 NOTE — Patient Instructions (Signed)
HANDOUTS PROVIDED ON: polyps  The polyps removed today have been sent for pathology.  The results can take 1-3 weeks to receive.  When your next colonoscopy should occur will be based on the pathology results.    You may resume your previous diet and medication schedule.  Thank you for allowing us to care for you today!!!  YOU HAD AN ENDOSCOPIC PROCEDURE TODAY AT THE [facility]:   Refer to the procedure report that was given to you for any specific questions about what was found during the examination.  If the procedure report does not answer your questions, please call your gastroenterologist to clarify.  If you requested that your care partner not be given the details of your procedure findings, then the procedure report has been included in a sealed envelope for you to review at your convenience later.  YOU SHOULD EXPECT: Some feelings of bloating in the abdomen. Passage of more gas than usual.  Walking can help get rid of the air that was put into your GI tract during the procedure and reduce the bloating. If you had a lower endoscopy (such as a colonoscopy or flexible sigmoidoscopy) you may notice spotting of blood in your stool or on the toilet paper. If you underwent a bowel prep for your procedure, you may not have a normal bowel movement for a few days.  Please Note:  You might notice some irritation and congestion in your nose or some drainage.  This is from the oxygen used during your procedure.  There is no need for concern and it should clear up in a day or so.  SYMPTOMS TO REPORT IMMEDIATELY:   Following lower endoscopy (colonoscopy or flexible sigmoidoscopy):  Excessive amounts of blood in the stool  Significant tenderness or worsening of abdominal pains  Swelling of the abdomen that is new, acute  Fever of 100F or higher   For urgent or emergent issues, a gastroenterologist can be reached at any hour by calling [phone]. Do not use MyChart messaging for  urgent concerns.    DIET:  We do recommend a small meal at first, but then you may proceed to your regular diet.  Drink plenty of fluids but you should avoid alcoholic beverages for 24 hours.  ACTIVITY:  You should plan to take it easy for the rest of today and you should NOT DRIVE or use heavy machinery until tomorrow (because of the sedation medicines used during the test).    FOLLOW UP: Our staff will call the number listed on your records 48-72 hours following your procedure to check on you and address any questions or concerns that you may have regarding the information given to you following your procedure. If we do not reach you, we will leave a message.  We will attempt to reach you two times.  During this call, we will ask if you have developed any symptoms of COVID 19. If you develop any symptoms (ie: fever, flu-like symptoms, shortness of breath, cough etc.) before then, please call [phone].  If you test positive for Covid 19 in the 2 weeks post procedure, please call and report this information to us.    If any biopsies were taken you will be contacted by phone or by letter within the next 1-3 weeks.  Please call us at [phone] if you have not heard about the biopsies in 3 weeks.    SIGNATURES/CONFIDENTIALITY: You and/or your care partner have signed paperwork which will be entered into   into your electronic medical record.  These signatures attest to the fact that that the information above on your After Visit Summary has been reviewed and is understood.  Full responsibility of the confidentiality of this discharge information lies with you and/or your care-partner.

## 2020-06-17 NOTE — Op Note (Signed)
[facility] Patient Name: Victoria Mullen Procedure Date: 06/17/2020 8:07 AM MRN: [id number] Endoscopist: Remo Lipps P. Havery Moros , MD Age: 69 Referring MD:  Date of Birth: 05/25/1951 Gender: Female Account #: [id number] Procedure:                Colonoscopy Indications:              High risk colon cancer surveillance: Personal                            history of colonic polyps - adenomas removed in 2018 Medicines:                Monitored Anesthesia Care Procedure:                Pre-Anesthesia Assessment:                           - Prior to the procedure, a History and Physical                            was performed, and patient medications and                            allergies were reviewed. The patient's tolerance of                            previous anesthesia was also reviewed. The risks                            and benefits of the procedure and the sedation                            options and risks were discussed with the patient.                            All questions were answered, and informed consent                            was obtained. Prior Anticoagulants: The patient has                            taken no previous anticoagulant or antiplatelet                            agents. ASA Grade Assessment: III - A patient with                            severe systemic disease. After reviewing the risks                            and benefits, the patient was deemed in                            satisfactory condition to undergo the procedure.  After obtaining informed consent, the colonoscope                            was passed under direct vision. Throughout the                            procedure, the patient's blood pressure, pulse, and                            oxygen saturations were monitored continuously. The                            Olympus [id number]-[id number]) Colonoscope was                             introduced through the anus and advanced to the the                            cecum, identified by appendiceal orifice and                            ileocecal valve. The colonoscopy was performed                            without difficulty. The patient tolerated the                            procedure well. The quality of the bowel                            preparation was good. The ileocecal valve,                            appendiceal orifice, and rectum were photographed. Scope In: 8:17:14 AM Scope Out: 8:41:35 AM Scope Withdrawal Time: 0 hours 20 minutes 15 seconds  Total Procedure Duration: 0 hours 24 minutes 21 seconds  Findings:                 The perianal and digital rectal examinations were                            normal.                           The colon was tortuous.                           A 3 mm polyp was found in the cecum. The polyp was                            flat. The polyp was removed with a cold snare.                            Resection and retrieval were complete.  A 4 mm polyp was found in the ascending colon. The                            polyp was flat. The polyp was removed with a cold                            snare. Resection and retrieval were complete.                           Two sessile polyps were found in the transverse                            colon. The polyps were 3 to 4 mm in size. These                            polyps were removed with a cold snare. Resection                            and retrieval were complete.                           Anal papilla(e) were hypertrophied.                           The exam was otherwise without abnormality. Complications:            No immediate complications. Estimated blood loss:                            Minimal. Estimated Blood Loss:     Estimated blood loss was minimal. Impression:               - Tortuous colon.                           - One 3 mm polyp  in the cecum, removed with a cold                            snare. Resected and retrieved.                           - One 4 mm polyp in the ascending colon, removed                            with a cold snare. Resected and retrieved.                           - Two 3 to 4 mm polyps in the transverse colon,                            removed with a cold snare. Resected and retrieved.                           - Anal papilla(e) were hypertrophied.                           -  The examination was otherwise normal. Recommendation:           - Patient has a contact number available for                            emergencies. The signs and symptoms of potential                            delayed complications were discussed with the                            patient. Return to normal activities tomorrow.                            Written discharge instructions were provided to the                            patient.                           - Resume previous diet.                           - Continue present medications.                           - Await pathology results. Remo Lipps P. Sashay Felling, MD 06/17/2020 8:45:39 AM This report has been signed electronically.

## 2020-06-22 ENCOUNTER — Telehealth: Payer: Self-pay | Admitting: *Deleted

## 2020-06-22 NOTE — Telephone Encounter (Signed)
  Follow up Call-  Call back number 06/17/2020  Post procedure Call Back phone  # [phone]  Permission to leave phone message Yes  Some recent data might be hidden     Patient questions:  Do you have a fever, pain , or abdominal swelling? No. Pain Score  0 *  Have you tolerated food without any problems? Yes.    Have you been able to return to your normal activities? Yes.    Do you have any questions about your discharge instructions: Diet   No. Medications  No. Follow up visit  No.  Do you have questions or concerns about your Care? No.  Actions: * If pain score is 4 or above: No action needed, pain <4.  1. Have you developed a fever since your procedure? no  2.   Have you had an respiratory symptoms (SOB or cough) since your procedure? no  3.   Have you tested positive for COVID 19 since your procedure no  4.   Have you had any family members/close contacts diagnosed with the COVID 19 since your procedure?  no   If yes to any of these questions please route to Laverna Peace, RN and Karlton Lemon, RN
